# Patient Record
Sex: Female | Born: 1980 | Race: Black or African American | Hispanic: No | Marital: Single | Smoking: Current some day smoker
Health system: Southern US, Community
[De-identification: ages and names within clinical notes are randomized; demographics above are authoritative.]

## PROBLEM LIST (undated history)

## (undated) DIAGNOSIS — Z789 Other specified health status: Secondary | ICD-10-CM

## (undated) DIAGNOSIS — T8859XA Other complications of anesthesia, initial encounter: Secondary | ICD-10-CM

## (undated) DIAGNOSIS — T4145XA Adverse effect of unspecified anesthetic, initial encounter: Secondary | ICD-10-CM

## (undated) DIAGNOSIS — D649 Anemia, unspecified: Secondary | ICD-10-CM

## (undated) HISTORY — PX: CHOLECYSTECTOMY: SHX55

---
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
## 2004-07-06 ENCOUNTER — Ambulatory Visit: Payer: Self-pay

## 2004-12-14 ENCOUNTER — Ambulatory Visit: Payer: Self-pay | Admitting: General Surgery

## 2008-01-24 ENCOUNTER — Emergency Department: Payer: Self-pay | Admitting: Internal Medicine

## 2009-01-08 ENCOUNTER — Emergency Department: Payer: Self-pay | Admitting: Emergency Medicine

## 2011-03-30 ENCOUNTER — Observation Stay: Payer: Self-pay

## 2011-05-05 ENCOUNTER — Inpatient Hospital Stay: Payer: Self-pay | Admitting: Obstetrics and Gynecology

## 2012-08-09 ENCOUNTER — Emergency Department: Payer: Self-pay | Admitting: Emergency Medicine

## 2012-08-09 LAB — URINALYSIS, COMPLETE
Glucose,UR: NEGATIVE mg/dL (ref 0–75)
Ketone: NEGATIVE
Leukocyte Esterase: NEGATIVE
Nitrite: NEGATIVE
Protein: NEGATIVE
Squamous Epithelial: 1
WBC UR: 1 /HPF (ref 0–5)

## 2012-08-09 LAB — CBC
HCT: 38.3 % (ref 35.0–47.0)
HGB: 12.6 g/dL (ref 12.0–16.0)
MCH: 28.1 pg (ref 26.0–34.0)
MCHC: 32.9 g/dL (ref 32.0–36.0)
MCV: 85 fL (ref 80–100)
Platelet: 284 10*3/uL (ref 150–440)
RBC: 4.49 10*6/uL (ref 3.80–5.20)
RDW: 14.3 % (ref 11.5–14.5)
WBC: 13.1 10*3/uL — ABNORMAL HIGH (ref 3.6–11.0)

## 2012-08-09 LAB — COMPREHENSIVE METABOLIC PANEL
Anion Gap: 8 (ref 7–16)
BUN: 13 mg/dL (ref 7–18)
Bilirubin,Total: 0.3 mg/dL (ref 0.2–1.0)
Co2: 23 mmol/L (ref 21–32)
Creatinine: 0.96 mg/dL (ref 0.60–1.30)
EGFR (African American): >60
Glucose: 91 mg/dL (ref 65–99)
Osmolality: 277 (ref 275–301)
SGOT(AST): 13 U/L — ABNORMAL LOW (ref 15–37)
Sodium: 139 mmol/L (ref 136–145)
Total Protein: 7.6 g/dL (ref 6.4–8.2)

## 2012-08-09 LAB — PREGNANCY, URINE: Pregnancy Test, Urine: NEGATIVE m[IU]/mL

## 2014-01-07 ENCOUNTER — Emergency Department: Payer: Self-pay | Admitting: Emergency Medicine

## 2014-06-09 ENCOUNTER — Emergency Department: Payer: Self-pay | Admitting: Emergency Medicine

## 2014-06-12 LAB — BETA STREP CULTURE(ARMC)

## 2016-06-28 NOTE — L&D Delivery Note (Signed)
Delivery Note At 7:16 PM a viable and healthy female "Katrina Jimenez" was delivered via Vaginal, Spontaneous Delivery (Presentation: ROA).  APGAR: 1,8; weight 6 lb 8.4 oz (2960 g).   Placenta status: spontaneous.  Cord: 3VC  with the following complications: none .  Cord pH: 7.19.  Anesthesia:  epidural Episiotomy: None Lacerations:  Bilateral labial, reapproximated Suture Repair: n/a Est. Blood Loss (mL): 300   Mom to postpartum.  Baby to Couplet care / Skin to Skin.  36yo [id number] at 37+2wks presented to triage with contractions, and found to have elevated blood pressures. I proceeded with plan for induction. However, on admission she was contracting and her cervix was difficult to reach, because of a hx of polyhydramnios and bulging bag of water - I could not feel her cervix at any time during her labor course.  She was uncomfortable and contracting and received an epidural. Fetal head still vertex by ultrasound prior to rupture. AROM carefully after discussion of possible outcomes, with fundal pressure and small wire hole, carefully guiding baby to the pelvis. Just prior to AROM, the fetal heart tones began to have deep decels, and I discussed that she needed to deliver quickly.  Once AROM, the baby was at 2+ and she pushed effectively and quickly and delivered over an intact perineum. Initial cry, then clamped and cut the cord and carried the baby to the warmer. heartrate <60, PPV started with attempts at spontaneous breathing. NP entered the room, and I returned to the perineum. Arterial cord blood collected.  Placenta delivered spontaneously. Minimal tearing, minimal bleeding. Mom tolerated procedure well.  Plans for bottle feeding and BTL.  [name] 04/22/2017, 7:47 PM

## 2016-10-27 ENCOUNTER — Other Ambulatory Visit: Payer: Self-pay | Admitting: Obstetrics and Gynecology

## 2016-10-27 DIAGNOSIS — Z369 Encounter for antenatal screening, unspecified: Secondary | ICD-10-CM

## 2016-11-01 ENCOUNTER — Encounter: Payer: Self-pay | Admitting: *Deleted

## 2016-11-01 ENCOUNTER — Ambulatory Visit
Admission: RE | Admit: 2016-11-01 | Discharge: 2016-11-01 | Disposition: A | Discharge status: Home / Self Care | Payer: Medicaid Other | Source: Ambulatory Visit | Attending: Maternal & Fetal Medicine | Admitting: Maternal & Fetal Medicine

## 2016-11-01 VITALS — BP 136/60 | HR 66 | Temp 98.2°F | Resp 17 | Ht 68.0 in | Wt 270.0 lb

## 2016-11-01 DIAGNOSIS — Z3A11 11 weeks gestation of pregnancy: Secondary | ICD-10-CM | POA: Diagnosis not present

## 2016-11-01 DIAGNOSIS — O09521 Supervision of elderly multigravida, first trimester: Secondary | ICD-10-CM | POA: Insufficient documentation

## 2016-11-01 DIAGNOSIS — Z369 Encounter for antenatal screening, unspecified: Secondary | ICD-10-CM

## 2016-11-01 HISTORY — DX: Other specified health status: Z78.9

## 2016-11-01 NOTE — Progress Notes (Signed)
Patient seen by me,  I agree with assessment and plan as outlined in [facility]'s note.

## 2016-11-01 NOTE — Progress Notes (Signed)
Referring Provider:   [facility] Length of Consultation: 45 minutes  Ms. Thurmond ButtsWade was [facility] for genetic counseling because of advanced maternal age.  The patient will be 36 years old at the time of delivery.  This note summarizes the information we discussed.    We explained that the chance of a chromosome abnormality increases with maternal age.  Chromosomes and examples of chromosome problems were reviewed.  Humans typically have 46 chromosomes in each cell, with half passed through each sperm and egg.  Any change in the number or structure of chromosomes can increase the risk of problems in the physical and mental development of a pregnancy.   Based upon age of the patient, the chance of any chromosome abnormality was 1 in 31114. The chance of Down syndrome, the most common chromosome problem associated with maternal age, was 1 in 75238.  The risk of chromosome problems is in addition to the 3% general population risk for birth defects and mental retardation.  The greatest chance, of course, is that the baby would be born in good health.  We discussed the following prenatal screening and testing options for this pregnancy:  First trimester screening, which includes nuchal translucency ultrasound screen and first trimester maternal serum marker screening.  The nuchal translucency has approximately an 80% detection rate for Down syndrome and can be positive for other chromosome abnormalities as well as heart defects.  When combined with a maternal serum marker screening, the detection rate is up to 90% for Down syndrome and up to 97% for trisomy 18.     The chorionic villus sampling procedure is available for first trimester chromosome analysis.  This involves the withdrawal of a small amount of chorionic villi (tissue from the developing placenta).  Risk of pregnancy loss is estimated to be approximately 1 in 200 to 1 in 100 (0.5 to 1%).  There is  approximately a 1% (1 in 100) chance that the CVS chromosome results will be unclear.  Chorionic villi cannot be tested for neural tube defects.     Maternal serum marker screening, a blood test that measures pregnancy proteins, can provide risk assessments for Down syndrome, trisomy 18, and open neural tube defects (spina bifida, anencephaly). Because it does not directly examine the fetus, it cannot positively diagnose or rule out these problems.  Targeted ultrasound uses high frequency sound waves to create an image of the developing fetus.  An ultrasound is often recommended as a routine means of evaluating the pregnancy.  It is also used to screen for fetal anatomy problems (for example, a heart defect) that might be suggestive of a chromosomal or other abnormality.   Amniocentesis involves the removal of a small amount of amniotic fluid from the sac surrounding the fetus with the use of a thin needle inserted through the maternal abdomen and uterus.  Ultrasound guidance is used throughout the procedure.  Fetal cells from amniotic fluid are directly evaluated and > 99.5% of chromosome problems and > 98% of open neural tube defects can be detected. This procedure is generally performed after the 15th week of pregnancy.  The main risks to this procedure include complications leading to miscarriage in less than 1 in 200 cases (0.5%).  We also reviewed the availability of cell free fetal DNA testing from maternal blood to determine whether or not the baby may have either Down syndrome, trisomy 2013, or trisomy 5118.  This test utilizes a maternal blood sample and  DNA sequencing technology to isolate circulating cell free fetal DNA from maternal plasma.  The fetal DNA can then be analyzed for DNA sequences that are derived from the three most common chromosomes involved in aneuploidy, chromosomes 13, 18, and 21.  If the overall amount of DNA is greater than the expected level for any of these chromosomes,  aneuploidy is suspected.  While we do not consider it a replacement for invasive testing and karyotype analysis, a negative result from this testing would be reassuring, though not a guarantee of a normal chromosome complement for the baby.  An abnormal result is certainly suggestive of an abnormal chromosome complement, though we would still recommend CVS or amniocentesis to confirm any findings from this testing.  Cystic Fibrosis and Spinal Muscular Atrophy (SMA) screening were also discussed with the patient. Both conditions are recessive, which means that both parents must be carriers in order to have a child with the disease.  Cystic fibrosis (CF) is one of the most common genetic conditions in persons of Caucasian ancestry.  This condition occurs in approximately 1 in 2,500 Caucasian persons and results in thickened secretions in the lungs, digestive, and reproductive systems.  For a baby to be at risk for having CF, both of the parents must be carriers for this condition.  Approximately 1 in 15 Caucasian persons is a carrier for CF.  Current carrier testing looks for the most common mutations in the gene for CF and can detect approximately 90% of carriers in the Caucasian population.  This means that the carrier screening can greatly reduce, but cannot eliminate, the chance for an individual to have a child with CF.  If an individual is found to be a carrier for CF, then carrier testing would be available for the partner. As part of [place] 's newborn screening profile, all babies born in the state of [place] will have a two-tier screening process.  Specimens are first tested to determine the concentration of immunoreactive trypsinogen (IRT).  The top 5% of specimens with the highest IRT values then undergo DNA testing using a panel of over 40 common CF mutations. SMA is a neurodegenerative disorder that leads to atrophy of skeletal muscle and overall weakness.  This condition is also more  prevalent in the Caucasian population, with 1 in 40-1 in 60 persons being a carrier and 1 in 6,000-1 in 10,000 children being affected.  There are multiple forms of the disease, with some causing death in infancy to other forms with survival into adulthood.  The genetics of SMA is complex, but carrier screening can detect up to 95% of carriers in the Caucasian population.  Similar to CF, a negative result can greatly reduce, but cannot eliminate, the chance to have a child with SMA.  Ms. Widener was already screened for hemoglobinopathies, which was normal (AA, MCV=86).  We obtained a detailed family history and pregnancy history.  Ms. Giebler reported that her paternal uncle was born with spina bifida.  He had surgery soon after birth.  He is in a wheelchair, has club feet, bladder control issues, developmental delays and a shunt.  She also had a great uncle who was born with spina bifida who passed away at age 57 years.  Neither relative had any other types of birth differences, and no other diagnosis was known as to the cause for their conditions. The remainder of the family history is unremarkable for birth defects, developmental delays, recurrent pregnancy loss or known chromosome abnormalities.  To  review, neural tube defects (also called spina bifida) happen very early in pregnancy.  During development, the spinal column starts out as a flat sheet of cells.  At approximately 4 weeks after conception, the sheet of cells begins to curl and form a tube.  This tube begins to fuse and zip closed.  It remains unclear if the closing begins at one point or starts at several places along the length of the spine.  Once the column is closed, the skull bones begin to form, initiating brain and other neural element formation.  If there is failure in the fusion of the tube, then an opening results.  The higher the opening, the more neurological problems typically result.  An opening at the area of the skull results in  anencephaly, which is not compatible with the baby surviving very long after birth.  A lower opening may result in lower body weakness to paralysis.  Intellectual disabilities may also occur due to excess fluid in the brain.  Most often, neural tube defects occur as an isolated birth defect, however, some are the result of changes in genes or in the number or structure of the chromosomes.  In the absence of other birth defects or a known genetic syndrome as the cause,  the chance for a neural tube defect is estimated to be less than 1%, which is increased above the general population risk of 1-2 per 1,000.  If a more specific recurrence risk is desired, we are happy to review medical records on either relative to determine if any genetic syndromes may have been diagnosed which would alter the recurrence chance.  There are 2 non-invasive testing options available to assess the chance for a neural tube defect in this pregnancy.  These are maternal serum AFP screening and level 2 ultrasound.  It is important to remember that every pregnancy has a risk of about 2-3% for having a birth defect.  Spina bifida is included in that number.  We discussed the need for taking folic acid in the future, particularly if you may become pregnant.  Studies have shown that taking 4 mg of folic acid each day can decrease the chance for a parent to have another child with spina bifida.  Ms. Gorton stated that this is her sixth pregnancy.  She has two children, a 61 year old daughter and a 41 year old son, both of whom are in good health.  She had three elective terminations for personal reasons.  The father of the baby shares her 108 year old son and has two other healthy boys from a prior relationship.  She reported no complications or exposures in this pregnancy that would be expected to increase the risk for birth defects.  She was smoking cigarettes prior to learning that she was pregnant, but stopped right away.  After  consideration of the options, Ms. Lueth elected to proceed with an ultrasound and cell free DNA testing.  She will scheduled her detailed anatomy ultrasound here at [facility] at [redacted] weeks gestation due to her age as well as the history of spina bifida in the family.  She will plan for msAFP testing at her OB at the appropriate gestation.  An ultrasound was performed at the time of the visit.  The gestational age was consistent with  12 weeks.  Fetal anatomy could not be assessed due to early gestational age.  Please refer to the ultrasound report for details of that study.  Ms. Gabor was encouraged to  call with questions or concerns.  We can be contacted at [phone].   Cherly Anderson, MS, CGC

## 2016-11-07 LAB — INFORMASEQ(SM) WITH XY ANALYSIS
Fetal Fraction (%):: 8.4
Fetal Number: 1
Gestational Age at Collection: 12.7 weeks
WEIGHT: 270 [lb_av]

## 2016-11-08 ENCOUNTER — Telehealth: Payer: Self-pay | Admitting: Obstetrics and Gynecology

## 2016-11-08 NOTE — Telephone Encounter (Signed)
The patient was informed of the results of her recent InformaSeq testing (performed at [facility]) which yielded NEGATIVE results.  The patient's specimen showed DNA consistent with two copies of chromosomes 21, 18 and 13.  The sensitivity for trisomy 21, trisomy 18 and trisomy 13 using this testing are reported as 99.1%, 98.3% and 98.1% respectively.  Thus, while the results of this testing are highly accurate, they are not considered diagnostic at this time.  Should more definitive information be desired, the patient may still consider amniocentesis.   As requested to know by the patient, sex chromosome analysis was included for this sample.  Results was consistent with a female (XX) fetus. This is predicted with >97% accuracy.  A maternal serum AFP only should be considered if screening for neural tube defects is desired.  Ernesha Ramone F. Trestan Vahle, MS, CGC   

## 2016-12-02 ENCOUNTER — Other Ambulatory Visit: Payer: Self-pay | Admitting: *Deleted

## 2016-12-02 DIAGNOSIS — O09522 Supervision of elderly multigravida, second trimester: Secondary | ICD-10-CM

## 2016-12-06 ENCOUNTER — Ambulatory Visit
Admission: RE | Admit: 2016-12-06 | Discharge: 2016-12-06 | Disposition: A | Discharge status: Home / Self Care | Payer: Medicaid Other | Source: Ambulatory Visit | Attending: Obstetrics and Gynecology | Admitting: Obstetrics and Gynecology

## 2016-12-06 DIAGNOSIS — O99210 Obesity complicating pregnancy, unspecified trimester: Secondary | ICD-10-CM

## 2016-12-06 DIAGNOSIS — O09522 Supervision of elderly multigravida, second trimester: Secondary | ICD-10-CM | POA: Insufficient documentation

## 2016-12-06 DIAGNOSIS — Z3A17 17 weeks gestation of pregnancy: Secondary | ICD-10-CM | POA: Diagnosis not present

## 2016-12-06 DIAGNOSIS — O99212 Obesity complicating pregnancy, second trimester: Secondary | ICD-10-CM | POA: Diagnosis present

## 2016-12-16 ENCOUNTER — Other Ambulatory Visit: Payer: Self-pay | Admitting: *Deleted

## 2016-12-16 DIAGNOSIS — Z0489 Encounter for examination and observation for other specified reasons: Secondary | ICD-10-CM

## 2016-12-16 DIAGNOSIS — O09522 Supervision of elderly multigravida, second trimester: Secondary | ICD-10-CM

## 2016-12-16 DIAGNOSIS — IMO0002 Reserved for concepts with insufficient information to code with codable children: Secondary | ICD-10-CM

## 2016-12-20 ENCOUNTER — Ambulatory Visit
Admission: RE | Admit: 2016-12-20 | Discharge: 2016-12-20 | Disposition: A | Discharge status: Home / Self Care | Payer: Medicaid Other | Source: Ambulatory Visit | Attending: Maternal & Fetal Medicine | Admitting: Maternal & Fetal Medicine

## 2016-12-20 DIAGNOSIS — Z0489 Encounter for examination and observation for other specified reasons: Secondary | ICD-10-CM

## 2016-12-20 DIAGNOSIS — IMO0002 Reserved for concepts with insufficient information to code with codable children: Secondary | ICD-10-CM

## 2016-12-20 DIAGNOSIS — Z3A19 19 weeks gestation of pregnancy: Secondary | ICD-10-CM | POA: Insufficient documentation

## 2016-12-20 DIAGNOSIS — O99212 Obesity complicating pregnancy, second trimester: Secondary | ICD-10-CM | POA: Insufficient documentation

## 2016-12-20 DIAGNOSIS — O09522 Supervision of elderly multigravida, second trimester: Secondary | ICD-10-CM | POA: Diagnosis present

## 2016-12-20 DIAGNOSIS — Z048 Encounter for examination and observation for other specified reasons: Secondary | ICD-10-CM | POA: Diagnosis not present

## 2017-01-14 ENCOUNTER — Ambulatory Visit: Payer: Medicaid Other | Admitting: Dietician

## 2017-01-20 ENCOUNTER — Encounter: Payer: Self-pay | Admitting: Dietician

## 2017-02-18 ENCOUNTER — Ambulatory Visit: Payer: Medicaid Other | Admitting: Dietician

## 2017-03-28 ENCOUNTER — Other Ambulatory Visit: Payer: Self-pay | Admitting: Obstetrics and Gynecology

## 2017-03-28 ENCOUNTER — Other Ambulatory Visit: Payer: Self-pay | Admitting: *Deleted

## 2017-03-28 DIAGNOSIS — Z3689 Encounter for other specified antenatal screening: Secondary | ICD-10-CM

## 2017-03-28 DIAGNOSIS — O409XX0 Polyhydramnios, unspecified trimester, not applicable or unspecified: Secondary | ICD-10-CM

## 2017-03-31 ENCOUNTER — Ambulatory Visit
Admission: RE | Admit: 2017-03-31 | Discharge: 2017-03-31 | Disposition: A | Discharge status: Home / Self Care | Payer: Medicaid Other | Source: Ambulatory Visit | Attending: Obstetrics and Gynecology | Admitting: Obstetrics and Gynecology

## 2017-03-31 DIAGNOSIS — O409XX0 Polyhydramnios, unspecified trimester, not applicable or unspecified: Secondary | ICD-10-CM

## 2017-03-31 DIAGNOSIS — O409XX3 Polyhydramnios, unspecified trimester, fetus 3: Secondary | ICD-10-CM | POA: Diagnosis not present

## 2017-03-31 DIAGNOSIS — Z3A34 34 weeks gestation of pregnancy: Secondary | ICD-10-CM | POA: Diagnosis not present

## 2017-04-13 ENCOUNTER — Encounter
Admission: RE | Admit: 2017-04-13 | Discharge: 2017-04-13 | Disposition: A | Discharge status: Home / Self Care | Payer: Medicaid Other | Source: Ambulatory Visit | Attending: Anesthesiology | Admitting: Anesthesiology

## 2017-04-13 NOTE — Consult Note (Signed)
Anesthesiology consult note ( Ambulatory referral to OB anesthesia for morbid obesity) :       I had the distinct pleasure of meeting Katrina Jimenez today for an OB anesthesia precheck.  She has a Hx of morbid obesity with a BMI today of 43 which puts her at the borderline for BMI.  Patient reports no previous problems with anesthesia, no problems with her back other than back pain with pregnancy and has a MP score of 2.  Her EDD is 11/14.  We discussed that in the next few weeks, she will need to watch her diet carefully and attempt some weight control with more nutritional choices along with water as the liquid of choice.  We discussed the possibility that if her weight increases much above 50 BMI we may have to make arrangements for delivery at a major medical center.  That her weight could make her higher risk from an anesthesia standpoint.  Patient appears to understand.  Obviously, her [facility] need [facility] and arrangements made if a marked change in BMI.  If she can stay at a BMI of 50 - 52 or less, we can deliver here.

## 2017-04-22 ENCOUNTER — Inpatient Hospital Stay
Admission: EM | Admit: 2017-04-22 | Discharge: 2017-04-24 | DRG: 798 | Disposition: A | Discharge status: Home / Self Care | Payer: Medicaid Other | Attending: Obstetrics and Gynecology | Admitting: Obstetrics and Gynecology

## 2017-04-22 ENCOUNTER — Inpatient Hospital Stay: Payer: Medicaid Other | Admitting: Registered Nurse

## 2017-04-22 DIAGNOSIS — Z87891 Personal history of nicotine dependence: Secondary | ICD-10-CM

## 2017-04-22 DIAGNOSIS — E669 Obesity, unspecified: Secondary | ICD-10-CM | POA: Diagnosis present

## 2017-04-22 DIAGNOSIS — Z3A37 37 weeks gestation of pregnancy: Secondary | ICD-10-CM

## 2017-04-22 DIAGNOSIS — O0993 Supervision of high risk pregnancy, unspecified, third trimester: Secondary | ICD-10-CM

## 2017-04-22 DIAGNOSIS — R030 Elevated blood-pressure reading, without diagnosis of hypertension: Secondary | ICD-10-CM | POA: Diagnosis present

## 2017-04-22 DIAGNOSIS — O26893 Other specified pregnancy related conditions, third trimester: Secondary | ICD-10-CM | POA: Diagnosis present

## 2017-04-22 DIAGNOSIS — Z7982 Long term (current) use of aspirin: Secondary | ICD-10-CM

## 2017-04-22 DIAGNOSIS — O99214 Obesity complicating childbirth: Secondary | ICD-10-CM | POA: Diagnosis present

## 2017-04-22 DIAGNOSIS — Z302 Encounter for sterilization: Secondary | ICD-10-CM

## 2017-04-22 LAB — CBC
HCT: 33.7 % — ABNORMAL LOW (ref 35.0–47.0)
Hemoglobin: 10.9 g/dL — ABNORMAL LOW (ref 12.0–16.0)
MCH: 26.5 pg (ref 26.0–34.0)
MCHC: 32.3 g/dL (ref 32.0–36.0)
MCV: 82.1 fL (ref 80.0–100.0)
PLATELETS: 250 10*3/uL (ref 150–440)
RBC: 4.1 MIL/uL (ref 3.80–5.20)
RDW: 15.7 % — AB (ref 11.5–14.5)
WBC: 13.2 10*3/uL — ABNORMAL HIGH (ref 3.6–11.0)

## 2017-04-22 LAB — COMPREHENSIVE METABOLIC PANEL
ALBUMIN: 2.6 g/dL — AB (ref 3.5–5.0)
ALT: 9 U/L — ABNORMAL LOW (ref 14–54)
AST: 23 U/L (ref 15–41)
Alkaline Phosphatase: 171 U/L — ABNORMAL HIGH (ref 38–126)
Anion gap: 8 (ref 5–15)
BILIRUBIN TOTAL: 0.4 mg/dL (ref 0.3–1.2)
CO2: 22 mmol/L (ref 22–32)
CREATININE: 0.8 mg/dL (ref 0.44–1.00)
Calcium: 8.8 mg/dL — ABNORMAL LOW (ref 8.9–10.3)
Chloride: 107 mmol/L (ref 101–111)
GFR calc Af Amer: >60 mL/min (ref >60)
GFR calc non Af Amer: >60 mL/min (ref >60)
GLUCOSE: 114 mg/dL — AB (ref 65–99)
Potassium: 3.5 mmol/L (ref 3.5–5.1)
Sodium: 137 mmol/L (ref 135–145)
TOTAL PROTEIN: 6.3 g/dL — AB (ref 6.5–8.1)

## 2017-04-22 LAB — TYPE AND SCREEN
ABO/RH(D): O POS
ANTIBODY SCREEN: NEGATIVE

## 2017-04-22 LAB — PROTEIN / CREATININE RATIO, URINE
Creatinine, Urine: 151 mg/dL
PROTEIN CREATININE RATIO: 0.11 mg/mg{creat} (ref 0.00–0.15)
TOTAL PROTEIN, URINE: 17 mg/dL

## 2017-04-22 MED ORDER — OXYTOCIN 10 UNIT/ML IJ SOLN
INTRAMUSCULAR | Status: AC
  Filled 2017-04-22: qty 2

## 2017-04-22 MED ORDER — SOD CITRATE-CITRIC ACID 500-334 MG/5ML PO SOLN: 30.0000 mL | ORAL | Status: DC | PRN

## 2017-04-22 MED ORDER — OXYTOCIN BOLUS FROM INFUSION
500.0000 mL | Freq: Once | INTRAVENOUS | Status: AC
  Administered 2017-04-22: 500 mL via INTRAVENOUS

## 2017-04-22 MED ORDER — OXYCODONE HCL 5 MG PO TABS
5.0000 mg | ORAL_TABLET | ORAL | Status: DC | PRN
  Administered 2017-04-23 (×3): 5 mg via ORAL
  Filled 2017-04-22 (×3): qty 1

## 2017-04-22 MED ORDER — PRENATAL MULTIVITAMIN CH
1.0000 | ORAL_TABLET | Freq: Every day | ORAL | Status: DC
  Administered 2017-04-23 – 2017-04-24 (×2): 1 via ORAL
  Filled 2017-04-22 (×2): qty 1

## 2017-04-22 MED ORDER — AMMONIA AROMATIC IN INHA
RESPIRATORY_TRACT | Status: AC
  Filled 2017-04-22: qty 10

## 2017-04-22 MED ORDER — BISACODYL 10 MG RE SUPP
10.0000 mg | Freq: Every day | RECTAL | Status: DC | PRN
  Filled 2017-04-22: qty 1

## 2017-04-22 MED ORDER — LIDOCAINE HCL (PF) 1 % IJ SOLN
INTRAMUSCULAR | Status: DC | PRN
  Administered 2017-04-22: 3 mL via SUBCUTANEOUS

## 2017-04-22 MED ORDER — SODIUM CHLORIDE 0.9 % IV SOLN
250.0000 mL | INTRAVENOUS | Status: DC | PRN
Start: 2017-04-22 — End: 2017-04-24

## 2017-04-22 MED ORDER — ACETAMINOPHEN 325 MG PO TABS
650.0000 mg | ORAL_TABLET | ORAL | Status: DC | PRN
  Administered 2017-04-23 – 2017-04-24 (×3): 650 mg via ORAL
  Filled 2017-04-22 (×3): qty 2

## 2017-04-22 MED ORDER — ZOLPIDEM TARTRATE 5 MG PO TABS: 5.0000 mg | ORAL_TABLET | Freq: Every evening | ORAL | Status: DC | PRN

## 2017-04-22 MED ORDER — SIMETHICONE 80 MG PO CHEW: 80.0000 mg | CHEWABLE_TABLET | ORAL | Status: DC | PRN

## 2017-04-22 MED ORDER — TERBUTALINE SULFATE 1 MG/ML IJ SOLN: 0.2500 mg | Freq: Once | INTRAMUSCULAR | Status: DC | PRN

## 2017-04-22 MED ORDER — CARBOPROST TROMETHAMINE 250 MCG/ML IM SOLN
INTRAMUSCULAR | Status: AC
  Filled 2017-04-22: qty 1

## 2017-04-22 MED ORDER — LACTATED RINGERS IV SOLN: 500.0000 mL | INTRAVENOUS | Status: DC | PRN

## 2017-04-22 MED ORDER — ACETAMINOPHEN 325 MG PO TABS: 650.0000 mg | ORAL_TABLET | ORAL | Status: DC | PRN

## 2017-04-22 MED ORDER — OXYTOCIN 40 UNITS IN LACTATED RINGERS INFUSION - SIMPLE MED: 1.0000 m[IU]/min | INTRAVENOUS | Status: DC

## 2017-04-22 MED ORDER — FENTANYL 2.5 MCG/ML W/ROPIVACAINE 0.15% IN NS 100 ML EPIDURAL (ARMC)
EPIDURAL | Status: DC | PRN
Start: 2017-04-22 — End: 2017-04-22
  Administered 2017-04-22: 12 mL/h via EPIDURAL

## 2017-04-22 MED ORDER — IBUPROFEN 600 MG PO TABS
ORAL_TABLET | ORAL | Status: AC
  Administered 2017-04-22: 600 mg via ORAL
  Filled 2017-04-22: qty 1

## 2017-04-22 MED ORDER — LACTATED RINGERS IV SOLN
INTRAVENOUS | Status: DC
  Administered 2017-04-22 – 2017-04-23 (×3): via INTRAVENOUS

## 2017-04-22 MED ORDER — SENNOSIDES-DOCUSATE SODIUM 8.6-50 MG PO TABS
2.0000 | ORAL_TABLET | ORAL | Status: DC
  Administered 2017-04-23 – 2017-04-24 (×2): 2 via ORAL
  Filled 2017-04-22 (×3): qty 2

## 2017-04-22 MED ORDER — SODIUM CHLORIDE 0.9% FLUSH: 3.0000 mL | INTRAVENOUS | Status: DC | PRN

## 2017-04-22 MED ORDER — IBUPROFEN 600 MG PO TABS
600.0000 mg | ORAL_TABLET | Freq: Four times a day (QID) | ORAL | Status: DC
  Administered 2017-04-22: 600 mg via ORAL

## 2017-04-22 MED ORDER — FENTANYL 2.5 MCG/ML W/ROPIVACAINE 0.15% IN NS 100 ML EPIDURAL (ARMC)
EPIDURAL | Status: AC
  Filled 2017-04-22: qty 100

## 2017-04-22 MED ORDER — MEASLES, MUMPS & RUBELLA VAC ~~LOC~~ INJ
0.5000 mL | INJECTION | Freq: Once | SUBCUTANEOUS | Status: DC
  Filled 2017-04-22: qty 0.5

## 2017-04-22 MED ORDER — TETANUS-DIPHTH-ACELL PERTUSSIS 5-2.5-18.5 LF-MCG/0.5 IM SUSP: 0.5000 mL | Freq: Once | INTRAMUSCULAR | Status: DC

## 2017-04-22 MED ORDER — SODIUM CHLORIDE 0.9 % IV SOLN: 2.0000 g | Freq: Once | INTRAVENOUS | Status: DC

## 2017-04-22 MED ORDER — BENZOCAINE-MENTHOL 20-0.5 % EX AERO: 1.0000 "application " | INHALATION_SPRAY | CUTANEOUS | Status: DC | PRN

## 2017-04-22 MED ORDER — METHYLERGONOVINE MALEATE 0.2 MG/ML IJ SOLN
INTRAMUSCULAR | Status: AC
  Filled 2017-04-22: qty 1

## 2017-04-22 MED ORDER — FLEET ENEMA 7-19 GM/118ML RE ENEM: 1.0000 | ENEMA | Freq: Every day | RECTAL | Status: DC | PRN

## 2017-04-22 MED ORDER — MISOPROSTOL 200 MCG PO TABS
ORAL_TABLET | ORAL | Status: AC
  Filled 2017-04-22: qty 4

## 2017-04-22 MED ORDER — ONDANSETRON HCL 4 MG/2ML IJ SOLN: 4.0000 mg | Freq: Four times a day (QID) | INTRAMUSCULAR | Status: DC | PRN

## 2017-04-22 MED ORDER — HYDRALAZINE HCL 20 MG/ML IJ SOLN: 10.0000 mg | Freq: Once | INTRAMUSCULAR | Status: DC | PRN

## 2017-04-22 MED ORDER — BUTORPHANOL TARTRATE 2 MG/ML IJ SOLN: 1.0000 mg | INTRAMUSCULAR | Status: DC | PRN

## 2017-04-22 MED ORDER — OXYTOCIN 40 UNITS IN LACTATED RINGERS INFUSION - SIMPLE MED
2.5000 [IU]/h | INTRAVENOUS | Status: DC
  Filled 2017-04-22: qty 1000

## 2017-04-22 MED ORDER — DIBUCAINE 1 % RE OINT: 1.0000 "application " | TOPICAL_OINTMENT | RECTAL | Status: DC | PRN

## 2017-04-22 MED ORDER — ONDANSETRON HCL 4 MG/2ML IJ SOLN: 4.0000 mg | INTRAMUSCULAR | Status: DC | PRN

## 2017-04-22 MED ORDER — ONDANSETRON HCL 4 MG PO TABS: 4.0000 mg | ORAL_TABLET | ORAL | Status: DC | PRN

## 2017-04-22 MED ORDER — LABETALOL HCL 5 MG/ML IV SOLN
20.0000 mg | INTRAVENOUS | Status: DC | PRN
  Filled 2017-04-22: qty 16

## 2017-04-22 MED ORDER — DIPHENHYDRAMINE HCL 25 MG PO CAPS: 25.0000 mg | ORAL_CAPSULE | Freq: Four times a day (QID) | ORAL | Status: DC | PRN

## 2017-04-22 MED ORDER — BUPIVACAINE HCL (PF) 0.25 % IJ SOLN
INTRAMUSCULAR | Status: DC | PRN
  Administered 2017-04-22 (×2): 5 mL via EPIDURAL

## 2017-04-22 MED ORDER — WITCH HAZEL-GLYCERIN EX PADS: 1.0000 "application " | MEDICATED_PAD | CUTANEOUS | Status: DC | PRN

## 2017-04-22 MED ORDER — COCONUT OIL OIL: 1.0000 "application " | TOPICAL_OIL | Status: DC | PRN

## 2017-04-22 MED ORDER — LIDOCAINE-EPINEPHRINE (PF) 1.5 %-1:200000 IJ SOLN
INTRAMUSCULAR | Status: DC | PRN
  Administered 2017-04-22: 3 mL via EPIDURAL

## 2017-04-22 MED ORDER — SODIUM CHLORIDE 0.9% FLUSH: 3.0000 mL | Freq: Two times a day (BID) | INTRAVENOUS | Status: DC

## 2017-04-22 MED ORDER — LIDOCAINE HCL (PF) 1 % IJ SOLN
30.0000 mL | INTRAMUSCULAR | Status: DC | PRN
  Filled 2017-04-22: qty 30

## 2017-04-22 NOTE — H&P (Signed)
OB ADMISSION/ HISTORY & PHYSICAL:  Admission Date: 04/22/2017  9:52 AM  Admit Diagnosis: Elevated BP, contractions  Katrina Jimenez is a 36 y.o. female presenting for contractions at 37+2wks, found to have elevated BP to 150s.  Prenatal History: G5P0   EDC : 05/11/2017, by Last Menstrual Period  [facility] Prenatal course complicated by  - Obesity: Prepregnancy BMI 45  If BMI > 45 Anesthesiology consult:ordered 03/23/17 - Polyhydramnios, now resolved [facility] consult on 03/23/17 due to poly at >95% 29.5cms  Polyhydramnios with Vertex on 04/06/17 [facility] Sterilization consent signed 02/15/17  Positive Trich in UA 02/15/2017 Treated, TOC 9/11/18ish  Prenatal labs GTT: 123 GBS:   neg   MBT O+ Ab screen Negative Pap Negative HIV Negative Hep B/RPR Negative/Non reactive Rubella Immune VZV Immune Hgb Frac Profile: WNL  Medical / Surgical History :  Past medical history:  Past Medical History:  Diagnosis Date  . Medical history non-contributory      Past surgical history:  Past Surgical History:  Procedure Laterality Date  . CHOLECYSTECTOMY      Family History:  Family History  Problem Relation Age of Onset  . Hypertension Mother   . Diabetes Mother   . Obesity Mother   . Diabetes Paternal Grandmother      Social History:  reports that she has quit smoking. She has quit using smokeless tobacco. She reports that she does not drink alcohol or use drugs.   Allergies: Patient has no known allergies.    Current Medications at time of admission:  Prior to Admission medications   Medication Sig Start Date End Date Taking? Authorizing Provider  aspirin EC 81 MG tablet Take 81 mg by mouth daily.   Yes [provider]  Prenatal Vit-Fe Fumarate-FA (MULTIVITAMIN-PRENATAL) 27-0.8 MG TABS tablet Take 1 tablet by mouth daily at 12 noon.    [provider]     Review of Systems: Active FM onset of ctx this morning currently  every 7 minutes  Physical Exam:  VS: Blood pressure (!) 164/94, pulse 64, temperature 98.2 F (36.8 C), temperature source Oral, resp. rate 18, last menstrual period 08/04/2016.  General: alert and oriented, appears NAD Heart: RRR Lungs: Clear lung fields Abdomen: Gravid, soft and non-tender, non-distended / uterus: difficult to assess fetal parts due to fluid Extremities: trace edema  Genitalia / VE: Dilation:  (unable to determine) Exam by:: BB, MD  FHR: baseline rate 130 / variability mod / accelerations + / no decelerations TOCO: q5-7  Last US 03/31/17 US:AFI 24.87 95%, NFA:[id number] gm (5#110z) @ 66%. Breech., Post, FHR:147  Assessment: 37+[redacted] weeks gestation 1 stage of labor FHR category 1 Cervical exam unable to be assessed due to bulging bag of water and vaginal redundancy   Plan:   Admit for induction of labor Labs pending Epidural when desired Continuous fetal monitoring   1. Fetal Well being  - Fetal Tracing: 1 - Ultrasound:  reviewed, as above - Group B Streptococcus: neg - Presentation: vtx confirmed by ultrasound   2. Routine OB: - Prenatal labs reviewed, as above - Rh positive  3. Induction of Labor:  -  Contractions external toco in place -  Plan for induction with pitocin  4. Post Partum Planning: - Infant feeding: breast - Contraception: BTL

## 2017-04-22 NOTE — Anesthesia Procedure Notes (Signed)
Epidural Patient location during procedure: OB Start time: 04/22/2017 2:53 PM End time: 04/22/2017 3:00 PM  Staffing Anesthesiologist: Yevette EdwardsADAMS, JAMES G Resident/CRNA: Karoline CaldwellSTARR, Oleva Koo Performed: resident/CRNA   Preanesthetic Checklist Completed: patient identified, site marked, surgical consent, pre-op evaluation, timeout performed, IV checked, risks and benefits discussed and monitors and equipment checked  Epidural Patient position: sitting Prep: Betadine Patient monitoring: heart rate, continuous pulse ox and blood pressure Approach: midline Location: L4-L5 Injection technique: LOR saline  Needle:  Needle type: Tuohy  Needle gauge: 17 G Needle length: 9 cm and 9 Needle insertion depth: 9 cm Catheter type: closed end flexible Catheter size: 19 Gauge Catheter at skin depth: 14 cm Test dose: negative and 1.5% lidocaine with Epi 1:200 K  Assessment Events: blood not aspirated, injection not painful, no injection resistance, negative IV test and no paresthesia  Additional Notes Pt. Evaluated and documentation done after procedure finished. Patient identified. Risks/Benefits/Options discussed with patient including but not limited to bleeding, infection, nerve damage, paralysis, failed block, incomplete pain control, headache, blood pressure changes, nausea, vomiting, reactions to medication both or allergic, itching and postpartum back pain. Confirmed with bedside nurse the patient's most recent platelet count. Confirmed with patient that they are not currently taking any anticoagulation, have any bleeding history or any family history of bleeding disorders. Patient expressed understanding and wished to proceed. All questions were answered. Sterile technique was used throughout the entire procedure. Please see nursing notes for vital signs. Test dose was given through epidural catheter and negative prior to continuing to dose epidural or start infusion. Warning signs of high block given  to the patient including shortness of breath, tingling/numbness in hands, complete motor block, or any concerning symptoms with instructions to call for help. Patient was given instructions on fall risk and not to get out of bed. All questions and concerns addressed with instructions to call with any issues or inadequate analgesia.   Patient tolerated the insertion well without immediate complications.Reason for block:procedure for pain

## 2017-04-22 NOTE — Discharge Summary (Signed)
Obstetrical Discharge Summary  Patient Name: Katrina Jimenez DOB: 1981/03/04 MRN: [id number]  Date of Admission: 04/22/2017 Date of Discharge: 04/24/2017  [facility]   Gestational Age at Delivery: 5545w2d   Antepartum complications:  Prenatal course complicated by  - Obesity: Prepregnancy BMI 45  If BMI > 45 Anesthesiology consult:ordered 03/23/17 - Polyhydramnios, now resolved [facility] consult on 03/23/17 due to poly at >95% 29.5cms  Polyhydramnios with Vertex on 04/06/17 [facility] Sterilization consent signed 02/15/17  Positive Trich in UA 02/15/2017 Treated, TOC 9/11/18ish Admitting Diagnosis: elevated BP, contractions Secondary Diagnosis: Patient Active Problem List   Diagnosis Date Noted  . Supervision of high-risk pregnancy, third trimester 04/22/2017  . Advanced maternal age in multigravida, first trimester     Augmentation: AROM Complications: None Intrapartum complications/course: see NSVD note Date of Delivery: 04/22/17 Delivered By: Christeen DouglasBethany Beasley  Delivery Type: spontaneous vaginal delivery Anesthesia: epidural Placenta: Spontaneous Laceration: none Episiotomy: none Newborn Data: Live born female "Tylasia Mahogany" Birth Weight: 6 lb 8.4 oz (2960 g) APGAR: 1, 8  Newborn Delivery   Birth date/time:  04/22/2017 19:16:00 Delivery type:  Vaginal, Spontaneous Delivery        Discharge Physical Exam:  BP 136/79 (BP Location: Right Arm)   Pulse 64   Temp 98.2 F (36.8 C) (Oral)   Resp 18   Ht 5\' 8"  (1.727 m)   Wt 133.8 kg (295 lb)   LMP 08/04/2016   SpO2 100%     General: NAD CV: RRR Pulm: CTABL, nl effort ABD: s/nd/nt, fundus firm and below the umbilicus Lochia: moderate Incision: c/d/i   DVT Evaluation: LE non-ttp, no evidence of DVT on exam.  Hemoglobin  Date Value Ref Range Status  04/24/2017 10.6 (L) 12.0 - 16.0 g/dL Final   HGB  Date Value Ref Range Status  08/09/2012 12.6 12.0 - 16.0 g/dL Final   HCT  Date  Value Ref Range Status  04/24/2017 32.7 (L) 35.0 - 47.0 % Final  08/09/2012 38.3 35.0 - 47.0 % Final    Post partum course: routine Postpartum Procedures: BTL Disposition: stable, discharge to home. Baby Feeding: formula Baby Disposition: home with mom  Contraception: BTL  Prenatal Labs:  O+ RI VI neg HIV neg HepB neg RPR GBS neg, genetic screening neg   Plan:  Katrina Jimenez was discharged to home in good condition. [facility] with Dr. Dalbert GarnetBeasley in 6 weeks   Discharge Medications: Allergies as of 04/24/2017   No Known Allergies     Medication List    STOP taking these medications   aspirin EC 81 MG tablet     TAKE these medications   ibuprofen 600 MG tablet Commonly known as:  ADVIL,MOTRIN Take 1 tablet (600 mg total) by mouth every 6 (six) hours.   multivitamin-prenatal 27-0.8 MG Tabs tablet Take 1 tablet by mouth daily at 12 noon.   oxyCODONE 5 MG immediate release tablet Commonly known as:  Oxy IR/ROXICODONE Take 1 tablet (5 mg total) by mouth every 4 (four) hours as needed (pain scale 4-7).       Follow-up Information    Christeen DouglasBeasley, Bethany, MD Follow up in 6 week(s).   Specialty:  Obstetrics and Gynecology Contact information: [address] [place] [id number] [phone]           Signed: ----- Ranae Plumberhelsea Edy Belt, MD Attending Obstetrician and Gynecologist [facility], Department of OB/GYN [facility]

## 2017-04-22 NOTE — OB Triage Note (Signed)
Ms. Thurmond ButtsWade here with c/o ctx since 0100, states "they were right on top of each other", reports taking tylenol, drinking water and showering, ctx now around 10 min apart. Rates pain 7/10 with ctx. Denies bleeding, LOF, reports positive fetal movement. Was in office and sent here as MD on call today in [place].

## 2017-04-22 NOTE — Anesthesia Preprocedure Evaluation (Signed)
Anesthesia Evaluation  Patient identified by MRN, date of birth, ID band Patient awake and Patient confused    Reviewed: Allergy & Precautions, H&P , NPO status , Patient's Chart, lab work & pertinent test results  Airway Mallampati: III  TM Distance: >3 FB Neck ROM: full    Dental  (+) Teeth Intact   Pulmonary neg pulmonary ROS, former smoker,    Pulmonary exam normal        Cardiovascular hypertension, Normal cardiovascular exam     Neuro/Psych    GI/Hepatic GERD  Poorly Controlled,  Endo/Other    Renal/GU      Musculoskeletal   Abdominal   Peds  Hematology negative hematology ROS (+)   Anesthesia Other Findings   Reproductive/Obstetrics (+) Pregnancy                             Anesthesia Physical Anesthesia Plan  ASA: II  Anesthesia Plan: Epidural   Post-op Pain Management:    Induction:   PONV Risk Score and Plan:   Airway Management Planned:   Additional Equipment:   Intra-op Plan:   Post-operative Plan:   Informed Consent: I have reviewed the patients History and Physical, chart, labs and discussed the procedure including the risks, benefits and alternatives for the proposed anesthesia with the patient or authorized representative who has indicated his/her understanding and acceptance.     Plan Discussed with: Anesthesiologist and CRNA  Anesthesia Plan Comments:         Anesthesia Quick Evaluation

## 2017-04-22 NOTE — OB Triage Provider Note (Signed)
TRIAGE VISIT with NST   Katrina Jimenez is a 36 y.o. G5P0. She is at 5528w2d gestation, presenting with signs of labor.  Indication: Contractions  S: Resting comfortably. Intermittent CTX, no VB. Active fetal movement. Concerned about blood pressure No headache, new onset swelling, RUQ pain. Recent PreE labs in office on 10/17 were wnl  O:  BP (!) 150/87 (BP Location: Right Arm)   Pulse 67   Temp 98.2 F (36.8 C) (Oral)   Resp 18   LMP 08/04/2016  No results found for this or any previous visit (from the past 48 hour(s)).   Gen: NAD, AAOx3      Abd: FNTTP      Ext: Non-tender, Nonedmeatous    NST/FHT: 135, mod var, +accels. No decels TOCO: quiet SVE:  pending  NST: Reactive. See FHT above for particulars.   A/P:  36 y.o. G5P0 6528w2d with contractions at term and elevated BP. - PreE labs - Urine protein - Cervical exam

## 2017-04-23 ENCOUNTER — Inpatient Hospital Stay: Payer: Medicaid Other | Admitting: Anesthesiology

## 2017-04-23 ENCOUNTER — Encounter: Payer: Self-pay | Admitting: Anesthesiology

## 2017-04-23 ENCOUNTER — Encounter
Admission: EM | Disposition: A | Discharge status: Home / Self Care | Payer: Self-pay | Source: Home / Self Care | Attending: Obstetrics and Gynecology

## 2017-04-23 HISTORY — PX: TUBAL LIGATION: SHX77

## 2017-04-23 LAB — CBC
HCT: 35.7 % (ref 35.0–47.0)
Hemoglobin: 11.6 g/dL — ABNORMAL LOW (ref 12.0–16.0)
MCH: 26.6 pg (ref 26.0–34.0)
MCHC: 32.5 g/dL (ref 32.0–36.0)
MCV: 81.8 fL (ref 80.0–100.0)
PLATELETS: 259 10*3/uL (ref 150–440)
RBC: 4.37 MIL/uL (ref 3.80–5.20)
RDW: 15.8 % — AB (ref 11.5–14.5)
WBC: 21.8 10*3/uL — ABNORMAL HIGH (ref 3.6–11.0)

## 2017-04-23 LAB — RPR: RPR: NONREACTIVE

## 2017-04-23 SURGERY — LIGATION, FALLOPIAN TUBE, POSTPARTUM
Anesthesia: General | Laterality: Bilateral | Wound class: Clean Contaminated

## 2017-04-23 MED ORDER — MIDAZOLAM HCL 2 MG/2ML IJ SOLN
INTRAMUSCULAR | Status: AC
  Filled 2017-04-23: qty 2

## 2017-04-23 MED ORDER — OXYCODONE HCL 5 MG/5ML PO SOLN: 5.0000 mg | Freq: Once | ORAL | Status: DC | PRN

## 2017-04-23 MED ORDER — ONDANSETRON HCL 4 MG/2ML IJ SOLN
INTRAMUSCULAR | Status: DC | PRN
  Administered 2017-04-23: 4 mg via INTRAVENOUS

## 2017-04-23 MED ORDER — ROCURONIUM BROMIDE 50 MG/5ML IV SOLN
INTRAVENOUS | Status: AC
  Filled 2017-04-23: qty 1

## 2017-04-23 MED ORDER — DEXAMETHASONE SODIUM PHOSPHATE 10 MG/ML IJ SOLN
INTRAMUSCULAR | Status: DC | PRN
  Administered 2017-04-23: 10 mg via INTRAVENOUS

## 2017-04-23 MED ORDER — LIDOCAINE HCL (PF) 2 % IJ SOLN
INTRAMUSCULAR | Status: AC
  Filled 2017-04-23: qty 10

## 2017-04-23 MED ORDER — LACTATED RINGERS IV SOLN
INTRAVENOUS | Status: DC | PRN
  Administered 2017-04-23 (×2): via INTRAVENOUS

## 2017-04-23 MED ORDER — SUGAMMADEX SODIUM 500 MG/5ML IV SOLN
INTRAVENOUS | Status: DC | PRN
  Administered 2017-04-23: 300 mg via INTRAVENOUS

## 2017-04-23 MED ORDER — PROPOFOL 10 MG/ML IV BOLUS
INTRAVENOUS | Status: AC
  Filled 2017-04-23: qty 20

## 2017-04-23 MED ORDER — SUGAMMADEX SODIUM 500 MG/5ML IV SOLN
INTRAVENOUS | Status: AC
  Filled 2017-04-23: qty 5

## 2017-04-23 MED ORDER — OXYCODONE HCL 5 MG PO TABS: 5.0000 mg | ORAL_TABLET | Freq: Once | ORAL | Status: DC | PRN

## 2017-04-23 MED ORDER — DEXAMETHASONE SODIUM PHOSPHATE 10 MG/ML IJ SOLN
INTRAMUSCULAR | Status: AC
  Filled 2017-04-23: qty 1

## 2017-04-23 MED ORDER — SEVOFLURANE IN SOLN
RESPIRATORY_TRACT | Status: AC
Start: 2017-04-23 — End: 2017-04-23
  Filled 2017-04-23: qty 250

## 2017-04-23 MED ORDER — BUPIVACAINE HCL 0.5 % IJ SOLN
INTRAMUSCULAR | Status: DC | PRN
  Administered 2017-04-23: 10 mL

## 2017-04-23 MED ORDER — LIDOCAINE HCL (CARDIAC) 20 MG/ML IV SOLN
INTRAVENOUS | Status: DC | PRN
  Administered 2017-04-23: 80 mg via INTRAVENOUS

## 2017-04-23 MED ORDER — FENTANYL CITRATE (PF) 100 MCG/2ML IJ SOLN
25.0000 ug | INTRAMUSCULAR | Status: DC | PRN
  Administered 2017-04-23 (×2): 50 ug via INTRAVENOUS

## 2017-04-23 MED ORDER — OXYCODONE HCL 5 MG PO TABS
10.0000 mg | ORAL_TABLET | ORAL | Status: DC | PRN
  Administered 2017-04-23 – 2017-04-24 (×3): 10 mg via ORAL
  Filled 2017-04-23 (×3): qty 2

## 2017-04-23 MED ORDER — FENTANYL CITRATE (PF) 100 MCG/2ML IJ SOLN
INTRAMUSCULAR | Status: AC
  Filled 2017-04-23: qty 2

## 2017-04-23 MED ORDER — MIDAZOLAM HCL 2 MG/2ML IJ SOLN
INTRAMUSCULAR | Status: DC | PRN
  Administered 2017-04-23: 2 mg via INTRAVENOUS

## 2017-04-23 MED ORDER — PROPOFOL 10 MG/ML IV BOLUS
INTRAVENOUS | Status: DC | PRN
  Administered 2017-04-23: 180 mg via INTRAVENOUS

## 2017-04-23 MED ORDER — FENTANYL CITRATE (PF) 100 MCG/2ML IJ SOLN
INTRAMUSCULAR | Status: DC | PRN
  Administered 2017-04-23: 25 ug via INTRAVENOUS
  Administered 2017-04-23 (×2): 50 ug via INTRAVENOUS

## 2017-04-23 MED ORDER — SUCCINYLCHOLINE CHLORIDE 20 MG/ML IJ SOLN
INTRAMUSCULAR | Status: DC | PRN
  Administered 2017-04-23: 140 mg via INTRAVENOUS

## 2017-04-23 MED ORDER — ROCURONIUM BROMIDE 100 MG/10ML IV SOLN
INTRAVENOUS | Status: DC | PRN
  Administered 2017-04-23: 15 mg via INTRAVENOUS
  Administered 2017-04-23: 5 mg via INTRAVENOUS

## 2017-04-23 MED ORDER — ONDANSETRON HCL 4 MG/2ML IJ SOLN
INTRAMUSCULAR | Status: AC
  Filled 2017-04-23: qty 2

## 2017-04-23 MED ORDER — IBUPROFEN 600 MG PO TABS
600.0000 mg | ORAL_TABLET | Freq: Four times a day (QID) | ORAL | Status: DC
  Administered 2017-04-23 – 2017-04-24 (×2): 600 mg via ORAL
  Filled 2017-04-23 (×2): qty 1

## 2017-04-23 SURGICAL SUPPLY — 31 items
BLADE SURG SZ11 CARB STEEL (BLADE) ×2 IMPLANT
CATH TRAY METER 16FR LF (MISCELLANEOUS) IMPLANT
CHLORAPREP W/TINT 26ML (MISCELLANEOUS) ×2 IMPLANT
DERMABOND ADVANCED (GAUZE/BANDAGES/DRESSINGS) ×1
DERMABOND ADVANCED .7 DNX12 (GAUZE/BANDAGES/DRESSINGS) ×1 IMPLANT
DRAPE LAPAROTOMY 100X77 ABD (DRAPES) ×2 IMPLANT
DRSG TEGADERM 2-3/8X2-3/4 SM (GAUZE/BANDAGES/DRESSINGS) IMPLANT
DRSG TELFA 4X3 1S NADH ST (GAUZE/BANDAGES/DRESSINGS) IMPLANT
ELECT CAUTERY BLADE 6.4 (BLADE) IMPLANT
ELECT REM PT RETURN 9FT ADLT (ELECTROSURGICAL) ×2
ELECTRODE REM PT RTRN 9FT ADLT (ELECTROSURGICAL) ×1 IMPLANT
GLOVE PI ORTHOPRO 6.5 (GLOVE) ×2
GLOVE PI ORTHOPRO STRL 6.5 (GLOVE) ×2 IMPLANT
GLOVE SURG SYN 6.5 ES PF (GLOVE) ×4 IMPLANT
GOWN STRL REUS W/ TWL LRG LVL3 (GOWN DISPOSABLE) ×2 IMPLANT
GOWN STRL REUS W/TWL LRG LVL3 (GOWN DISPOSABLE) ×2
KIT RM TURNOVER CYSTO AR (KITS) ×2 IMPLANT
LABEL OR SOLS (LABEL) IMPLANT
LIGASURE BLUNT 5MM 37CM (INSTRUMENTS) IMPLANT
NDL SAFETY 22GX1.5 (NEEDLE) ×2 IMPLANT
NS IRRIG 500ML POUR BTL (IV SOLUTION) ×2 IMPLANT
PACK BASIN MINOR ARMC (MISCELLANEOUS) ×2 IMPLANT
RETRACTOR WOUND ALXS 18CM SML (MISCELLANEOUS) ×1 IMPLANT
RTRCTR WOUND ALEXIS O 18CM SML (MISCELLANEOUS) ×2
SPONGE LAP 18X18 5 PK (GAUZE/BANDAGES/DRESSINGS) ×2 IMPLANT
SUT MNCRL AB 4-0 PS2 18 (SUTURE) ×2 IMPLANT
SUT VIC AB 0 CT2 27 (SUTURE) ×2 IMPLANT
SUT VIC AB 2-0 CT1 27 (SUTURE) ×2
SUT VIC AB 2-0 CT1 TAPERPNT 27 (SUTURE) ×2 IMPLANT
SUT VIC AB 2-0 UR6 27 (SUTURE) ×2 IMPLANT
SYRINGE 10CC LL (SYRINGE) ×2 IMPLANT

## 2017-04-23 NOTE — Transfer of Care (Signed)
Immediate Anesthesia Transfer of Care Note  Patient: Corinna LinesLatoya M Mataya  Procedure(s) Performed: POST PARTUM TUBAL LIGATION (Bilateral )  Patient Location: PACU  Anesthesia Type:General  Level of Consciousness: awake, alert  and oriented  Airway & Oxygen Therapy: Patient Spontanous Breathing  Post-op Assessment: Report given to RN and Post -op Vital signs reviewed and stable  Post vital signs: Reviewed and stable  Last Vitals:  Vitals:   04/23/17 0500 04/23/17 0929  BP: (!) 147/86 (!) 148/86  Pulse: 64 87  Resp: 20 18  Temp: 37.1 C 36.9 C  SpO2: 99% 100%    Last Pain:  Vitals:   04/23/17 0929  TempSrc: Temporal  PainSc:          Complications: No apparent anesthesia complications

## 2017-04-23 NOTE — Anesthesia Procedure Notes (Signed)
Procedure Name: Intubation Date/Time: 04/23/2017 8:16 AM Performed by: Hedda Slade Pre-anesthesia Checklist: Patient identified, Patient being monitored, Timeout performed, Emergency Drugs available and Suction available Patient Re-evaluated:Patient Re-evaluated prior to induction Oxygen Delivery Method: Circle system utilized Preoxygenation: Pre-oxygenation with 100% oxygen Induction Type: IV induction, Cricoid Pressure applied and Rapid sequence Laryngoscope Size: Mac and 3 Grade View: Grade II Tube type: Oral Tube size: 7.0 mm Number of attempts: 1 Airway Equipment and Method: Stylet Placement Confirmation: ETT inserted through vocal cords under direct vision,  positive ETCO2 and breath sounds checked- equal and bilateral Secured at: 21 cm Tube secured with: Tape Dental Injury: Teeth and Oropharynx as per pre-operative assessment

## 2017-04-23 NOTE — Op Note (Signed)
  Post Partum Tubal Ligation  Indication for procedure: desired permanent sterilization  Pre-op diagnosis: s/p term vaginal delivery, desired permanent sterilization Post-op diagnosis: same Procedure: post partum bilateral tubal ligation Surgeon: Chelsea Jimenez Assist: none Anesthesia: general  IVF: 300cc crystalloid EBL: minimal UOP: none Findings: patent bilateral tubes, normal post-gravid uterine fundus Specimens: portion of left tube, portion of right tube  Complications: none apparent Disposition: stable to PACU  Procedure in detail: The patient was seen and confirmed desire for permanent sterilization.  She was identified as Katrina Jimenez and was brought to the OR.  General anesthesia was administered, and the patient was prepped and draped in the usual sterile fashion.  A surgical time-out was called.  An 11-blade was used to incise the skin in the infraumbilical area.  The subcutaneous tissues were dissected to and then through the fascia, and the peritoneum was grasped and divided.  An alexis retractor was placed in the abdominal cavity and positioned.  The uterus was identified, and the right tube was grasped with a babcock and traced to the fimbriated end.  The mesosalpinx was divided. A kelly clamp was placed along the mesosalpinx and metzenbaums were used to transect the tube from the underlying tissue.  2-0 Vicryl was used to tie the remaining tissue.  The same steps were used on the left side.  All dissected ends were found to be hemostatic.  The alexis retractor was removed, and the fascia was reapproximated with 1 vicryl.  The subcutaneous tissue was irrigated and then the skin was closed with 4-0 monocryl.  The skin was covered in surgical glue.  The sponge, needle, and instrument counts were correct x2.  The patient tolerated the procedure and was brought to PACU in a stable condition.  Katrina Plumberhelsea Ward, MD Attending Obstetrician and Gynecologist [facility]  [facility]

## 2017-04-23 NOTE — Anesthesia Post-op Follow-up Note (Signed)
Anesthesia QCDR form completed.        

## 2017-04-23 NOTE — Progress Notes (Signed)
Patient transported to Pre-Op via bed with tech.

## 2017-04-23 NOTE — Progress Notes (Signed)
Post Partum Day 1 Subjective: Doing well, no complaints.  Tolerating regular diet, but NPO since midnight, voiding and ambulating without difficulty.  No CP SOB F/C N/V or leg pain no HA change of vision, RUQ/epigastric pain  Objective: BP (!) 147/86   Pulse 64   Temp 98.7 F (37.1 C) (Oral)   Resp 20   Ht 5\' 8"  (1.727 m)   Wt 133.8 kg (295 lb)   LMP 08/04/2016   SpO2 99%   Breastfeeding? Unknown   BMI 44.85 kg/m    Physical Exam:  General: NAD CV: RRR Pulm: nl effort, CTABL Lochia: moderate Uterine Fundus: fundus firm and below umbilicus DVT Evaluation: no cords, ttp LEs    Recent Labs  04/22/17 1214 04/23/17 0613  HGB 10.9* 11.6*  HCT 33.7* 35.7  WBC 13.2* 21.8*  PLT 250 259    Assessment/Plan: 36 y.o. [id number] postpartum day # 1  1. Patient doing well overall 2.. BP: no s/sx severe preeclampsia 3. PPTL: patient certain she would like to proceed.  Preoperative evaluation performed, she understands this procedure is permanent.  Will proceed.   ----- Ranae Plumberhelsea Ward, MD Attending Obstetrician and Gynecologist [facility]

## 2017-04-23 NOTE — Anesthesia Postprocedure Evaluation (Signed)
Anesthesia Post Note  Patient: Corinna LinesLatoya M Shappell  Procedure(s) Performed: POST PARTUM TUBAL LIGATION (Bilateral )  Patient location during evaluation: PACU Anesthesia Type: General Level of consciousness: awake and alert Pain management: pain level controlled Vital Signs Assessment: post-procedure vital signs reviewed and stable Respiratory status: spontaneous breathing, nonlabored ventilation, respiratory function stable and patient connected to nasal cannula oxygen Cardiovascular status: blood pressure returned to baseline and stable Postop Assessment: no apparent nausea or vomiting Anesthetic complications: no     Last Vitals:  Vitals:   04/23/17 0955 04/23/17 1023  BP:  (!) 164/85  Pulse:  68  Resp:  16  Temp:  36.6 C  SpO2: 97% 98%    Last Pain:  Vitals:   04/23/17 1023  TempSrc: Oral  PainSc:                  Cleda MccreedyJoseph K Piscitello

## 2017-04-23 NOTE — Anesthesia Postprocedure Evaluation (Signed)
Anesthesia Post Note  Patient: Katrina Jimenez  Procedure(s) Performed: AN AD HOC LABOR EPIDURAL  Patient location during evaluation: PACU Anesthesia Type: Epidural Level of consciousness: awake and alert Pain management: pain level controlled Vital Signs Assessment: post-procedure vital signs reviewed and stable Respiratory status: spontaneous breathing, nonlabored ventilation and respiratory function stable Cardiovascular status: stable Postop Assessment: no headache and no backache (Able to ambulate) Anesthetic complications: no     Last Vitals:  Vitals:   04/23/17 0107 04/23/17 0500  BP: (!) 153/83 (!) 147/86  Pulse: 62 64  Resp: 18 20  Temp: 36.5 C 37.1 C  SpO2: 99% 99%    Last Pain:  Vitals:   04/23/17 0500  TempSrc: Oral  PainSc:                  Katrina Jimenez

## 2017-04-23 NOTE — Progress Notes (Signed)
15 minute call to floor. 

## 2017-04-23 NOTE — Plan of Care (Signed)
Problem: Pain Managment: Goal: General experience of comfort will improve Outcome: Progressing Pain medication given as ordered. Patient complaining of right sided abd pain post-op. Liquid adhesive to lap site, clean, dry and intact with no drainage noted.  Problem: Physical Regulation: Goal: Will remain free from infection Outcome: Progressing No new signs or symptoms of infection noted  Problem: Fluid Volume: Goal: Ability to maintain a balanced intake and output will improve Outcome: Progressing Patient tolerating orals well, eating and drinking as family and visitor bought in food for patient today. Denies any nausea this shift.

## 2017-04-23 NOTE — Anesthesia Preprocedure Evaluation (Addendum)
Anesthesia Evaluation  Patient identified by MRN, date of birth, ID band Patient awake    Reviewed: Allergy & Precautions, H&P , NPO status , Patient's Chart, lab work & pertinent test results  History of Anesthesia Complications Negative for: history of anesthetic complications  Airway Mallampati: III  TM Distance: >3 FB Neck ROM: full    Dental  (+) Chipped   Pulmonary former smoker,           Cardiovascular Exercise Tolerance: Good hypertension, (-) angina(-) Past MI and (-) DOE      Neuro/Psych negative neurological ROS  negative psych ROS   GI/Hepatic negative GI ROS, Neg liver ROS, neg GERD  ,  Endo/Other  negative endocrine ROS  Renal/GU      Musculoskeletal   Abdominal   Peds  Hematology negative hematology ROS (+)   Anesthesia Other Findings   Past Surgical History: No date: CHOLECYSTECTOMY  BMI    Body Mass Index:  44.85 kg/m      Reproductive/Obstetrics negative OB ROS                            Anesthesia Physical Anesthesia Plan  ASA: III  Anesthesia Plan: General ETT   Post-op Pain Management:    Induction: Intravenous  PONV Risk Score and Plan: 3 and Ondansetron, Dexamethasone and Midazolam  Airway Management Planned: Oral ETT  Additional Equipment:   Intra-op Plan:   Post-operative Plan: Extubation in OR  Informed Consent: I have reviewed the patients History and Physical, chart, labs and discussed the procedure including the risks, benefits and alternatives for the proposed anesthesia with the patient or authorized representative who has indicated his/her understanding and acceptance.   Dental Advisory Given  Plan Discussed with: Anesthesiologist, CRNA and Surgeon  Anesthesia Plan Comments: (Patient consented for risks of anesthesia including but not limited to:  - adverse reactions to medications - damage to teeth, lips or other oral mucosa -  sore throat or hoarseness - Damage to heart, brain, lungs or loss of life  Patient voiced understanding.)        Anesthesia Quick Evaluation

## 2017-04-24 ENCOUNTER — Encounter: Payer: Self-pay | Admitting: Obstetrics & Gynecology

## 2017-04-24 LAB — CBC
HCT: 32.7 % — ABNORMAL LOW (ref 35.0–47.0)
Hemoglobin: 10.6 g/dL — ABNORMAL LOW (ref 12.0–16.0)
MCH: 26.5 pg (ref 26.0–34.0)
MCHC: 32.5 g/dL (ref 32.0–36.0)
MCV: 81.6 fL (ref 80.0–100.0)
PLATELETS: 256 10*3/uL (ref 150–440)
RBC: 4.01 MIL/uL (ref 3.80–5.20)
RDW: 15.7 % — AB (ref 11.5–14.5)
WBC: 19.4 10*3/uL — AB (ref 3.6–11.0)

## 2017-04-24 MED ORDER — IBUPROFEN 600 MG PO TABS
600.0000 mg | ORAL_TABLET | Freq: Four times a day (QID) | ORAL | Status: DC
  Administered 2017-04-24: 600 mg via ORAL
  Filled 2017-04-24: qty 1

## 2017-04-24 MED ORDER — IBUPROFEN 600 MG PO TABS: 600.0000 mg | ORAL_TABLET | Freq: Four times a day (QID) | ORAL | 0 refills | Status: DC

## 2017-04-24 MED ORDER — OXYCODONE HCL 5 MG PO TABS: 5.0000 mg | ORAL_TABLET | ORAL | 0 refills | Status: DC | PRN

## 2017-04-24 NOTE — Progress Notes (Signed)
Discharge instructions reviewed with patient.  All questions answered.

## 2017-04-24 NOTE — Discharge Instructions (Signed)

## 2017-04-26 LAB — SURGICAL PATHOLOGY

## 2018-01-02 IMAGING — US US MFM OB FOLLOW-UP
1 series · 13 of 18 positions shown · non-contrast
Comparison: none

PATIENT INFO:

PERFORMED BY:
SERVICE(S) PROVIDED:
INDICATIONS:
19 weeks gestation of pregnancy
Anatomic survey
Obesity
Advanced maternal age
FETAL EVALUATION:
Num Of Fetuses:     1
Preg. Location:     Mid
Fetal Heart         136
Rate(bpm):
Cardiac Activity:   Present
Presentation:       Vertex
Placenta:           Anterior Grade 0, No previa
Amniotic Fluid
AFI FV:      Within normal limits
GESTATIONAL AGE:
LMP:           18w 3d        Date:  08/13/16                 EDD:   05/20/17
Best:          19w 5d     Det. By:  Previous Ultrasound      EDD:   05/11/17
(10/25/16)
ANATOMY:
Face:                  Within Normal Limits   Stomach:                Seen
Lips:                  Normal appearance      Abdomen:                Cord Insertion
Within Normal Limits
Heart:                 Normal appearance      Abdominal Wall:         Normal appearance
RVOT:                  Normal appearance      Bladder:                Seen
LVOT:                  Normal appearance

[Series 1: us mfm ob follow-up · 13 of 18 slices shown]
[im 1/18]
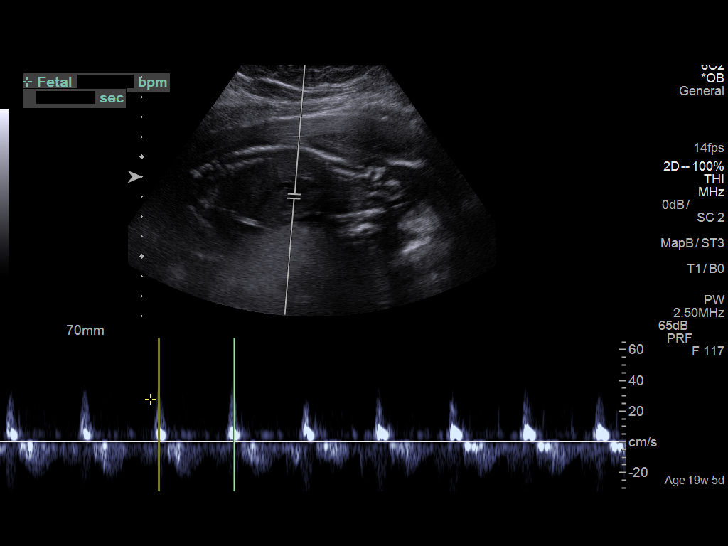
[im 3/18]
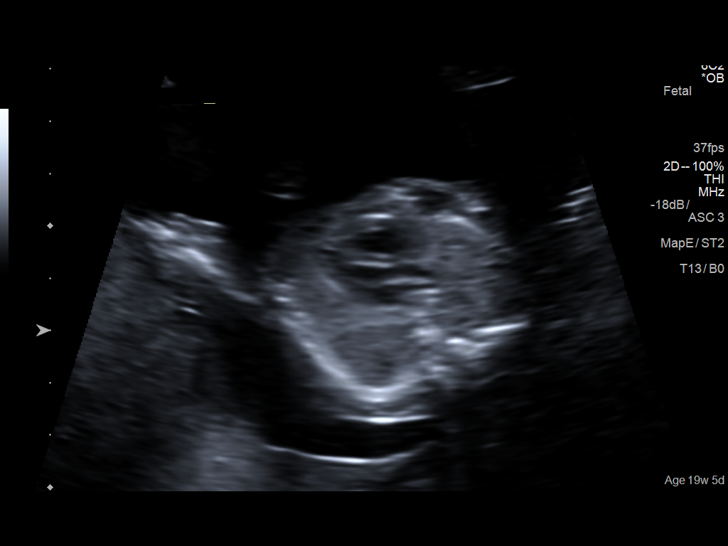
[im 4/18]
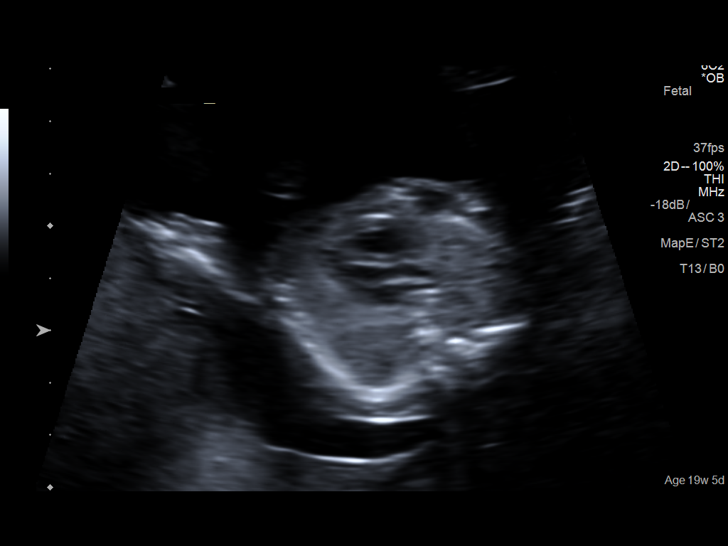
[im 5/18]
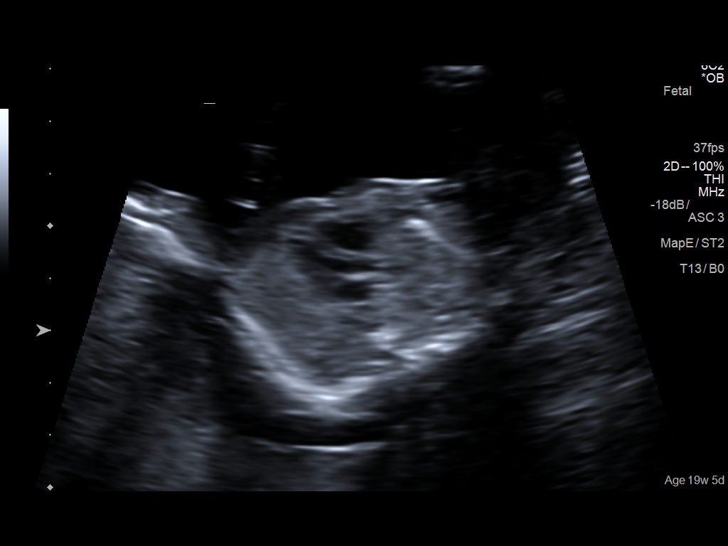
[im 7/18]
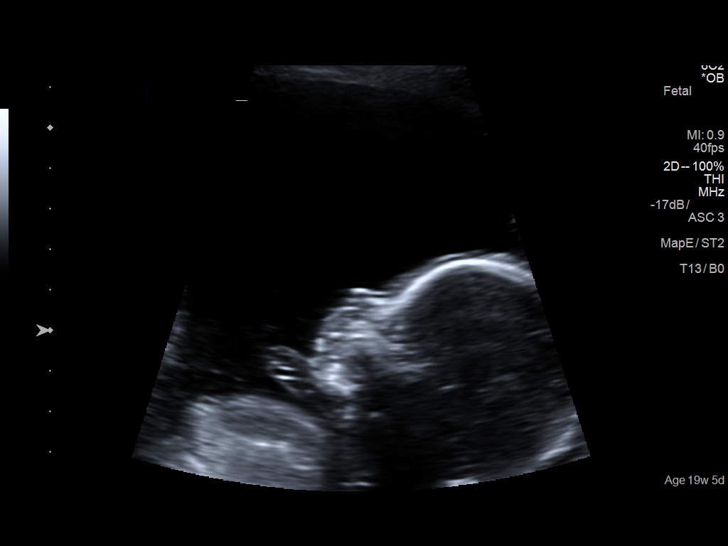
[im 8/18]
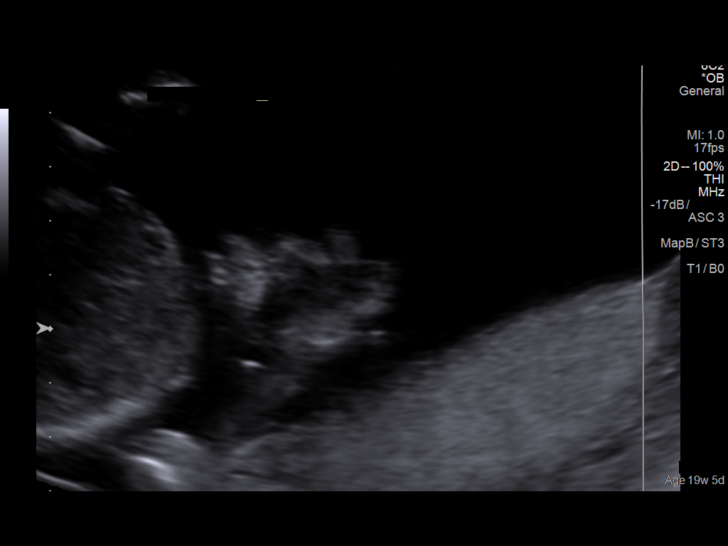
[im 10/18]
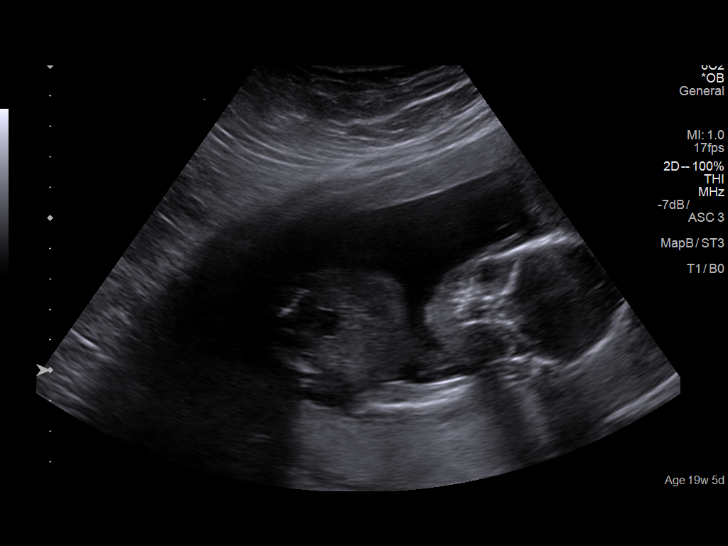
[im 11/18]
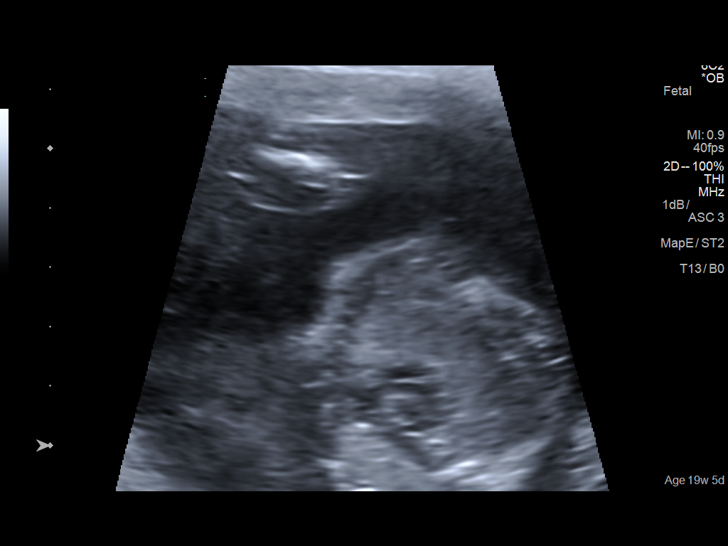
[im 12/18]
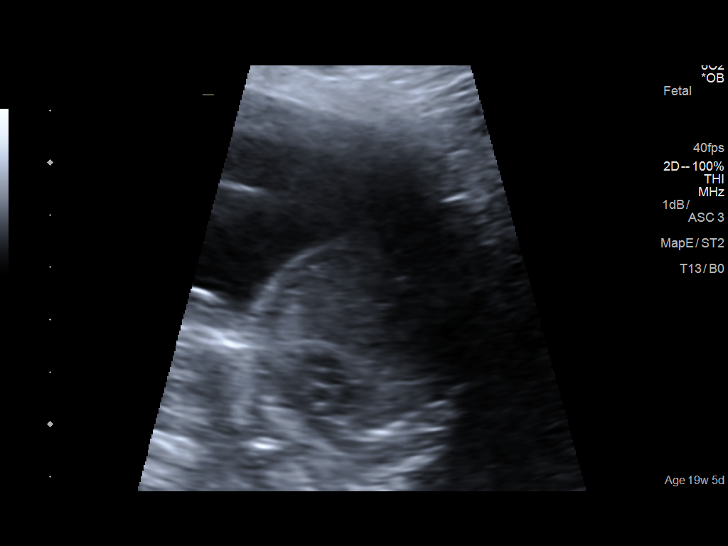
[im 14/18]
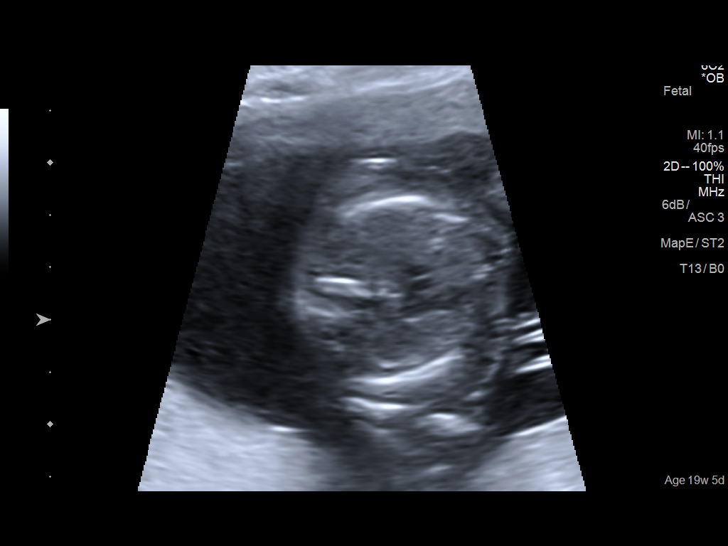
[im 15/18]
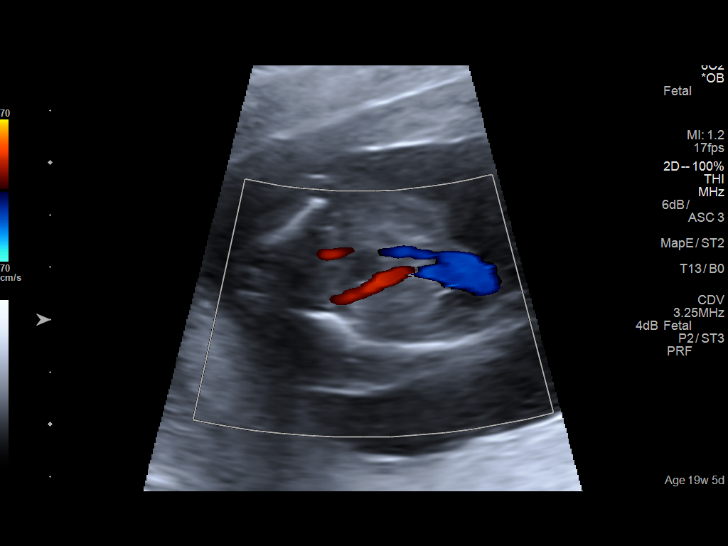
[im 16/18]
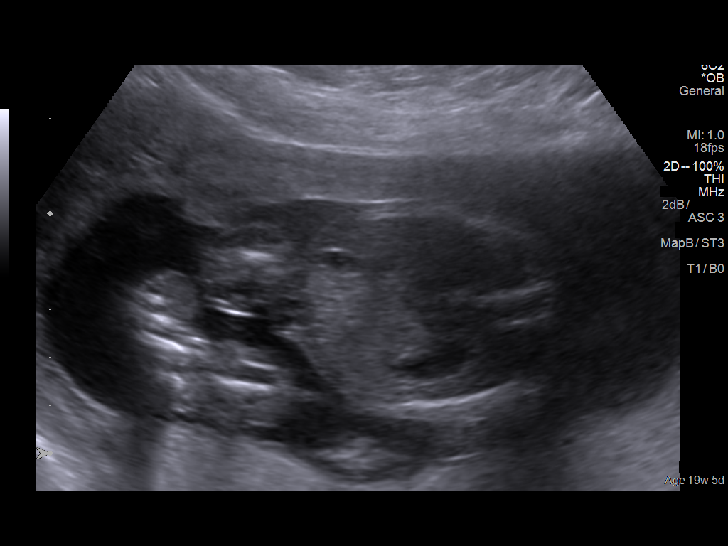
[im 18/18]
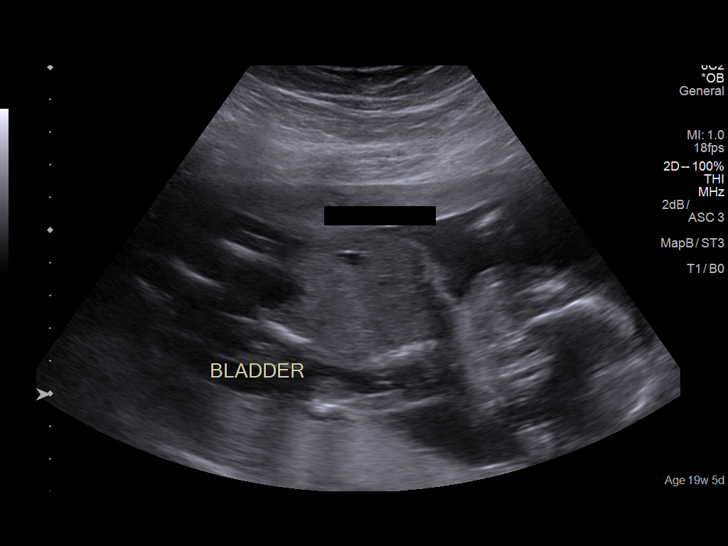

[13 of 18 positions shown; findings below may reference images not displayed]

IMPRESSION: Ms. Gustavo Ricardo returns today for completion of the fetal anatomic
survey.  She was originally referred to [facility] for
detailed ultrasound  for the indication of morbid obesity and
advanced maternal age. She was scanned on 12/06/2016 at
which time, due to early gestational age and poor maternal
acoustics, the nose/lip, outflow tracts of the heart, bicaval
view and 3 vessel trachea view were suboptimally seen.
Other fetal anatomy visualized appeared normal.  Ms. Gustavo Ricardo
previously had genetic counseling and had negative cell free
fetal DNA screening.

Today, there is an intrauterine pregnancy with a best
estimated gestational age of 19 weeks 5 days.  Dating is
based on earliest available ultrasound performed at [REDACTED] on 10/25/2016; measurements reported then as 11
weeks 5 days.  The examination was limited to the fetal heart
and face.  Views that were not visualized previously were
viewed today and appear normal.
The patient was offered and declined AFP testing today.
RECOMMENDATIONS:

We would recommend monthly ultrasounds for growth in the
third trimester.
Shawanda

## 2018-04-13 IMAGING — US US MFM OB FOLLOW-UP
1 series · 13 of 28 positions shown · non-contrast
Comparison: none

pm)
PATIENT INFO:

PERFORMED BY:
SERVICE(S) PROVIDED:
INDICATIONS:
34 weeks gestation of pregnancy
FETAL EVALUATION:
Num Of Fetuses:     1
Fetal Heart         147
Rate(bpm):
Presentation:       Breech
Placenta:           Posterior Grade [DATE], No previa
AFI Sum(cm)     %Tile       Largest Pocket(cm)
24.87           95
RUQ(cm)       RLQ(cm)       LUQ(cm)        LLQ(cm)
3.84
BIOMETRY:
BPD:      85.3  mm     G. Age:  34w 2d         55  %    CI:        72.52   %    70 - 86
FL/HC:      21.4   %    19.4 -
HC:      318.6  mm     G. Age:  35w 6d         58  %    HC/AC:      1.03        0.96 -
AC:      309.9  mm     G. Age:  34w 6d         76  %    FL/BPD:     80.0   %    71 - 87
FL:       68.2  mm     G. Age:  35w 0d         64  %    FL/AC:      22.0   %    20 - 24
HUM:      60.6  mm     G. Age:  35w 1d         78  %
Est. FW:    6390  gm    5 lb 11 oz      66  %
GESTATIONAL AGE:
LMP:           32w 6d        Date:  08/13/16                 EDD:   05/20/17
U/S Today:     35w 0d                                        EDD:   05/05/17
Best:          34w 1d     Det. By:  Previous Ultrasound      EDD:   05/11/17
(10/25/16)
ANATOMY:
Cavum:                 Visualized             Stomach:                Seen
previously
Ventricles:            Normal appearance      Abdominal Wall:         Visualized
Cerebellum:            Visualized             Cord Vessels:           3 vessels,
previously                                     visualized previously
Posterior Fossa:       Visualized             Kidneys:                Normal appearance
Face:                  Orbits visualized      Bladder:                Seen
Lips:                  Visualized             Spine:                  Appears WNL
Heart:                 4-Chamber view         Upper Extremities:      Visualized
appears normal                                 previously
RVOT:                  Seen on prior          Lower Extremities:      Visualized
LVOT:                  Seen on prior

[Series 1: us mfm ob follow-up · 0.30mm/px · 13 of 41 slices shown]
[im 2/41]
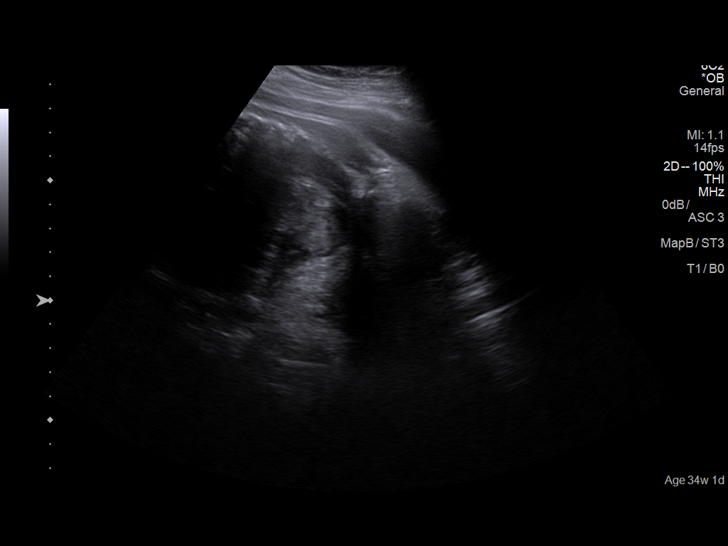
[im 5/41]
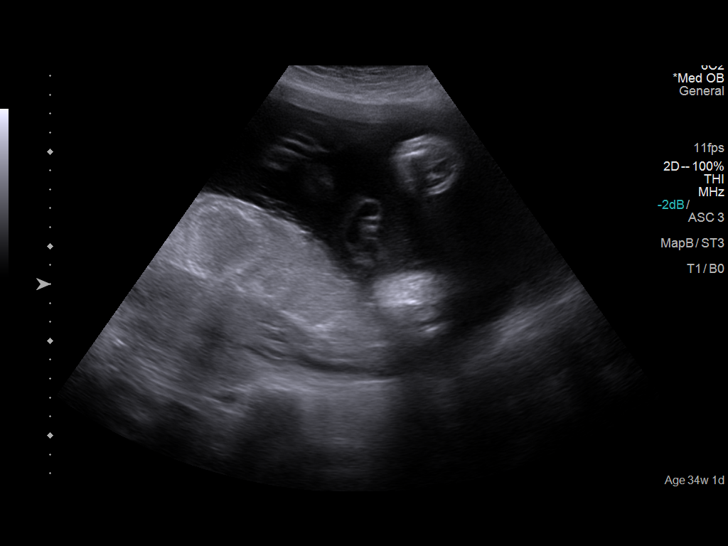
[im 8/41]
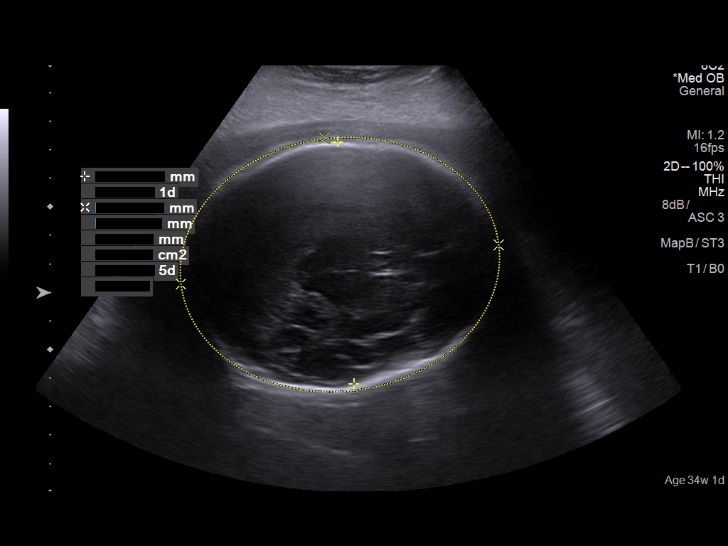
[im 11/41]
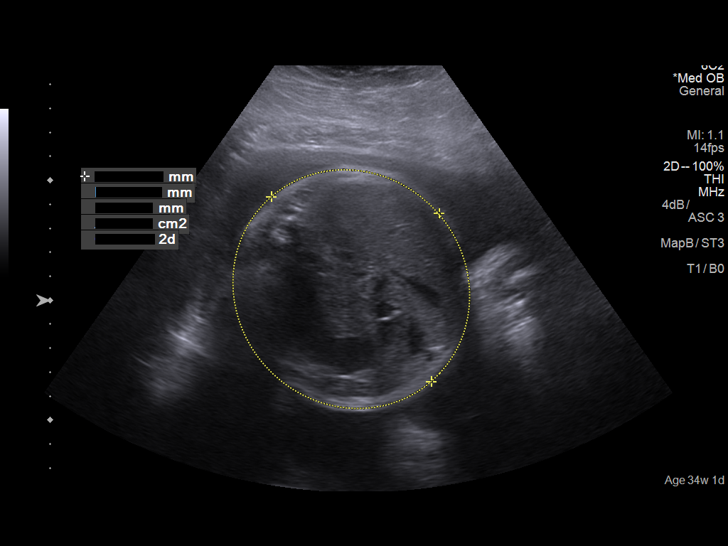
[im 14/41]
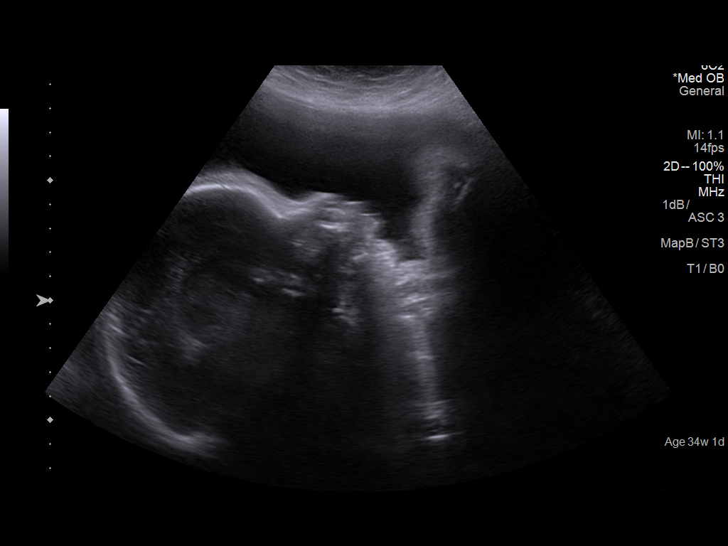
[im 17/41]
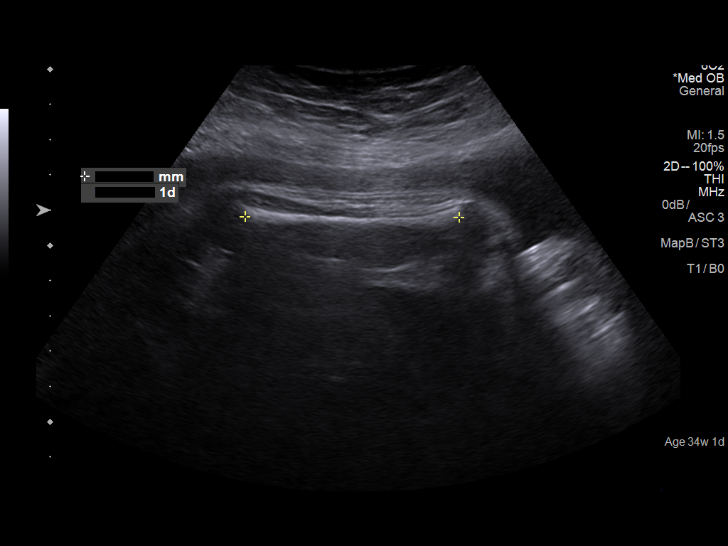
[im 21/41]
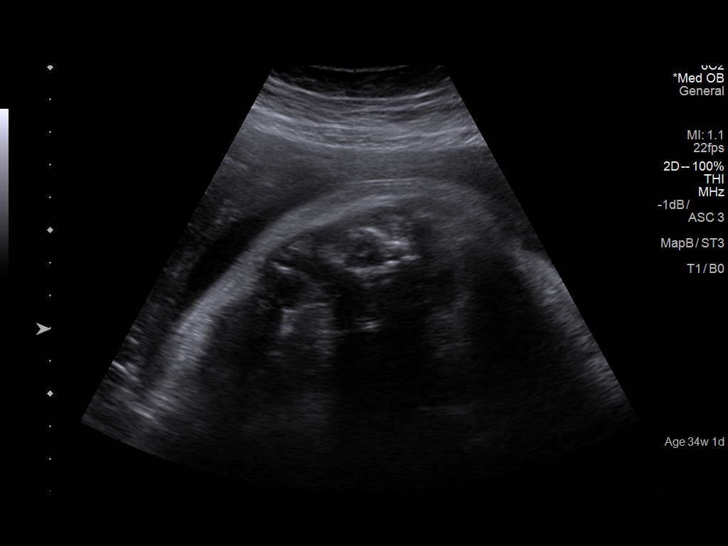
[im 24/41]
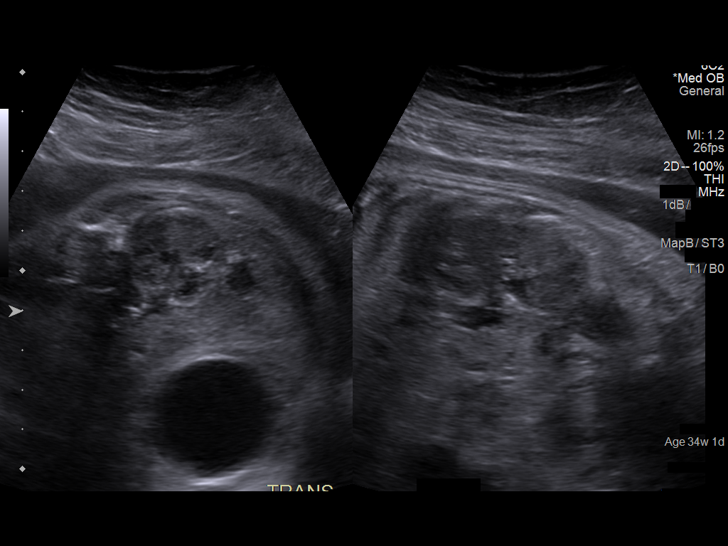
[im 27/41]
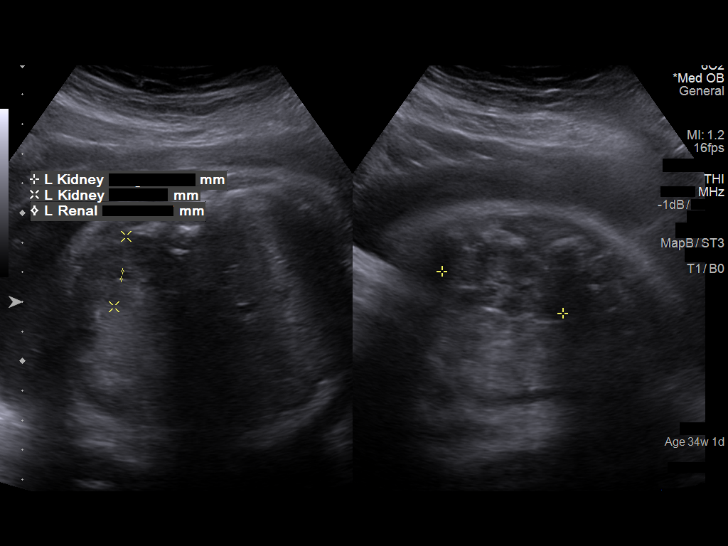
[im 30/41]
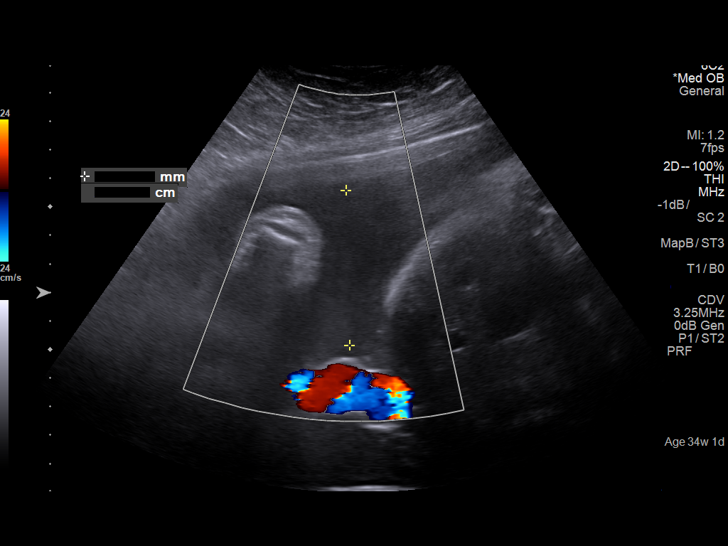
[im 33/41]
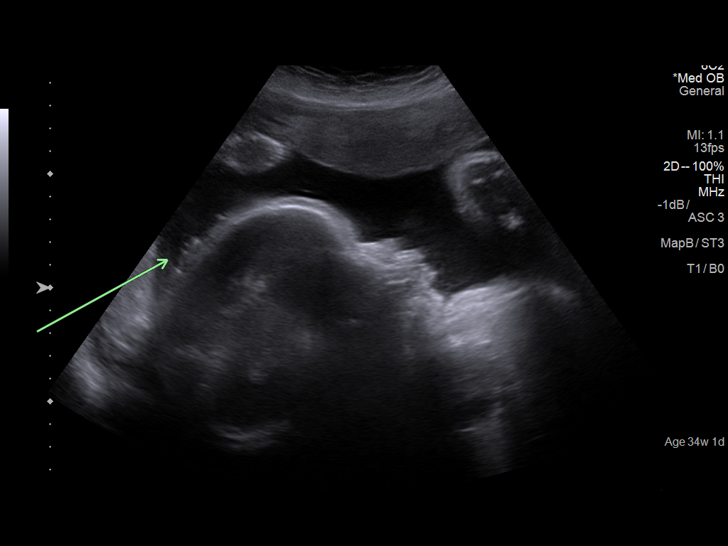
[im 36/41]
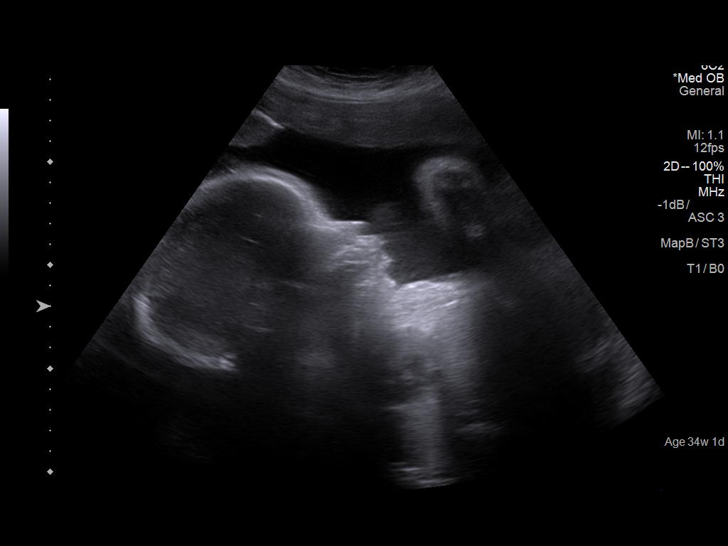
[im 39/41]
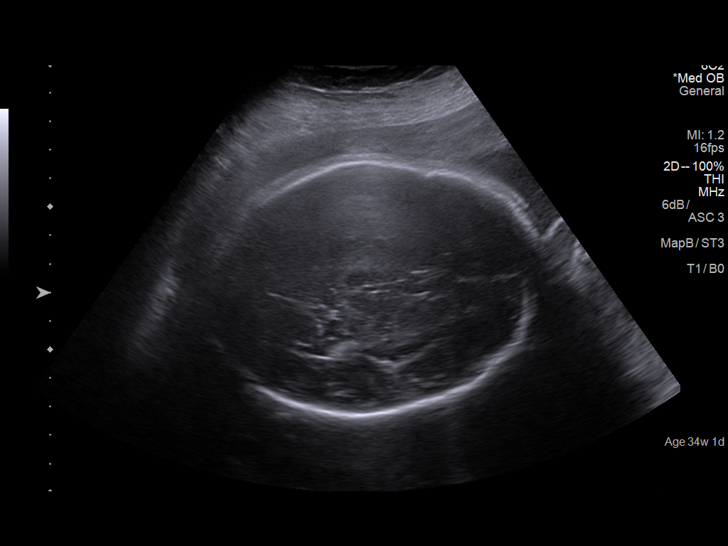

[13 of 28 positions shown; findings below may reference images not displayed]

IMPRESSION: Dear Ms. Imon,

Thank you for referring your patient  for a fetal growth
evaluation.

There is a singleton gestation with subjectively  high normal
amniotic fluid volume. AFI =24 MVP is 9.9 cm. ( While there
is a single large pocket there are extremities in the pocket
and AC touches uterine wall .)
BREECH position

The fetal biometry correlates with established dating.
Pt is at 34 w 1d based on earliest scan performed on 10/25/16
performed at [REDACTED] at 11 w 5d with EDC of 05/11/17
Adequate interval growth noted.
The estimated fetal weight is at the 66   percentile. [place] [employer].

Recommend follow-up scan for fetal growth in 3 weeks as
clinically indicated.
Consider weekly NST AFI for elevated BMI

Thank you for allowing us to participate in your patient's care.
assistance.

## 2019-06-14 ENCOUNTER — Inpatient Hospital Stay: Admission: RE | Admit: 2019-06-14 | Payer: Medicaid Other | Source: Ambulatory Visit

## 2019-06-21 ENCOUNTER — Other Ambulatory Visit: Payer: Medicaid Other

## 2019-08-06 ENCOUNTER — Encounter
Admission: RE | Admit: 2019-08-06 | Discharge: 2019-08-06 | Disposition: A | Discharge status: Home / Self Care | Payer: Managed Care, Other (non HMO) | Source: Ambulatory Visit | Attending: Obstetrics & Gynecology | Admitting: Obstetrics & Gynecology

## 2019-08-06 ENCOUNTER — Other Ambulatory Visit: Payer: Self-pay

## 2019-08-06 HISTORY — DX: Anemia, unspecified: D64.9

## 2019-08-06 HISTORY — DX: Other complications of anesthesia, initial encounter: T88.59XA

## 2019-08-06 NOTE — Patient Instructions (Signed)
Your procedure is scheduled on: 08/20/19 Report to DAY SURGERY DEPARTMENT LOCATED ON 2ND FLOOR MEDICAL MALL ENTRANCE. To find out your arrival time please call [phone] between 1PM - 3PM on 08/17/19.  Remember: Instructions that are not followed completely may result in serious medical risk, up to and including death, or upon the discretion of your surgeon and anesthesiologist your surgery may need to be rescheduled.     _X__ 1. Do not eat food after midnight the night before your procedure.                 No gum chewing or hard candies. You may drink clear liquids up to 2 hours                 before you are scheduled to arrive for your surgery- DO not drink clear                 liquids within 2 hours of the start of your surgery.                 Clear Liquids include:  water, apple juice without pulp, clear carbohydrate                 drink such as Clearfast or Gatorade, Black Coffee or Tea (Do not add                 anything to coffee or tea). Diabetics water only  ENSURE PRE SURGERY CARB DRINK NEEDS TO BE FINISHED 2 HOURS BEFORE YOUR ARRIVAL THE DAY OF SURGERY.  __X__2.  On the morning of surgery brush your teeth with toothpaste and water, you                 may rinse your mouth with mouthwash if you wish.  Do not swallow any              toothpaste of mouthwash.     _X__ 3.  No Alcohol for 24 hours before or after surgery.   _X__ 4.  Do Not Smoke or use e-cigarettes For 24 Hours Prior to Your Surgery.                 Do not use any chewable tobacco products for at least 6 hours prior to                 surgery.  ____  5.  Bring all medications with you on the day of surgery if instructed.   __X__  6.  Notify your doctor if there is any change in your medical condition      (cold, fever, infections).     Do not wear jewelry, make-up, hairpins, clips or nail polish. Do not wear lotions, powders, or perfumes.  Do not shave 48 hours prior to surgery. Men may shave face and  neck. Do not bring valuables to the hospital.    [facility] is not responsible for any belongings or valuables.  Contacts, dentures/partials or body piercings may not be worn into surgery. Bring a case for your contacts, glasses or hearing aids, a denture cup will be supplied. Leave your suitcase in the car. After surgery it may be brought to your room. For patients admitted to the hospital, discharge time is determined by your treatment team.   Patients discharged the day of surgery will not be allowed to drive home.   Please read over the following fact sheets that you were given:  MRSA Information  __X__ Take these medicines the morning of surgery with A SIP OF WATER:    1. NONE  2.   3.   4.  5.  6.  ____ Fleet Enema (as directed)   __X__ Use CHG Soap/SAGE wipes as directed  ____ Use inhalers on the day of surgery  ____ Stop metformin/Janumet/Farxiga 2 days prior to surgery    ____ Take 1/2 of usual insulin dose the night before surgery. No insulin the morning          of surgery.   ____ Stop Blood Thinners Coumadin/Plavix/Xarelto/Pleta/Pradaxa/Eliquis/Effient/Aspirin  on   Or contact your Surgeon, Cardiologist or Medical Doctor regarding  ability to stop your blood thinners  __X__ Stop Anti-inflammatories 7 days before surgery such as Advil, Ibuprofen, Motrin,  BC or Goodies Powder, Naprosyn, Naproxen, Aleve, Aspirin    __X__ Stop all herbal supplements, fish oil or vitamin E until after surgery.    ____ Bring C-Pap to the hospital.

## 2019-08-15 ENCOUNTER — Other Ambulatory Visit
Admission: RE | Admit: 2019-08-15 | Discharge: 2019-08-15 | Disposition: A | Discharge status: Home / Self Care | Payer: Managed Care, Other (non HMO) | Source: Ambulatory Visit | Attending: Obstetrics & Gynecology | Admitting: Obstetrics & Gynecology

## 2019-08-15 ENCOUNTER — Other Ambulatory Visit: Payer: Self-pay

## 2019-08-15 DIAGNOSIS — Z20822 Contact with and (suspected) exposure to covid-19: Secondary | ICD-10-CM | POA: Insufficient documentation

## 2019-08-15 DIAGNOSIS — Z01812 Encounter for preprocedural laboratory examination: Secondary | ICD-10-CM | POA: Diagnosis present

## 2019-08-15 LAB — BASIC METABOLIC PANEL
Anion gap: 8 (ref 5–15)
BUN: 16 mg/dL (ref 6–20)
CO2: 23 mmol/L (ref 22–32)
Calcium: 9.2 mg/dL (ref 8.9–10.3)
Chloride: 108 mmol/L (ref 98–111)
Creatinine, Ser: 1.11 mg/dL — ABNORMAL HIGH (ref 0.44–1.00)
GFR calc Af Amer: >60 mL/min (ref >60)
GFR calc non Af Amer: >60 mL/min (ref >60)
Glucose, Bld: 104 mg/dL — ABNORMAL HIGH (ref 70–99)
Potassium: 3.9 mmol/L (ref 3.5–5.1)
Sodium: 139 mmol/L (ref 135–145)

## 2019-08-15 LAB — CBC
HCT: 39.7 % (ref 36.0–46.0)
Hemoglobin: 12.8 g/dL (ref 12.0–15.0)
MCH: 26.8 pg (ref 26.0–34.0)
MCHC: 32.2 g/dL (ref 30.0–36.0)
MCV: 83.1 fL (ref 80.0–100.0)
Platelets: 273 10*3/uL (ref 150–400)
RBC: 4.78 MIL/uL (ref 3.87–5.11)
RDW: 14.5 % (ref 11.5–15.5)
WBC: 11.1 10*3/uL — ABNORMAL HIGH (ref 4.0–10.5)
nRBC: 0 % (ref 0.0–0.2)

## 2019-08-15 LAB — TYPE AND SCREEN
ABO/RH(D): O POS
Antibody Screen: NEGATIVE

## 2019-08-15 LAB — SARS CORONAVIRUS 2 (TAT 6-24 HRS): SARS Coronavirus 2: NEGATIVE

## 2019-08-20 ENCOUNTER — Ambulatory Visit
Admission: RE | Admit: 2019-08-20 | Discharge: 2019-08-20 | Disposition: A | Discharge status: Home / Self Care | Payer: 59 | Attending: Obstetrics & Gynecology | Admitting: Obstetrics & Gynecology

## 2019-08-20 ENCOUNTER — Other Ambulatory Visit: Payer: Self-pay

## 2019-08-20 ENCOUNTER — Encounter: Payer: Self-pay | Admitting: Obstetrics & Gynecology

## 2019-08-20 ENCOUNTER — Ambulatory Visit: Payer: 59 | Admitting: Anesthesiology

## 2019-08-20 ENCOUNTER — Encounter
Admission: RE | Disposition: A | Discharge status: Home / Self Care | Payer: Self-pay | Source: Home / Self Care | Attending: Obstetrics & Gynecology

## 2019-08-20 DIAGNOSIS — N9489 Other specified conditions associated with female genital organs and menstrual cycle: Secondary | ICD-10-CM | POA: Insufficient documentation

## 2019-08-20 DIAGNOSIS — Z8249 Family history of ischemic heart disease and other diseases of the circulatory system: Secondary | ICD-10-CM | POA: Diagnosis not present

## 2019-08-20 DIAGNOSIS — N946 Dysmenorrhea, unspecified: Secondary | ICD-10-CM | POA: Insufficient documentation

## 2019-08-20 DIAGNOSIS — F17200 Nicotine dependence, unspecified, uncomplicated: Secondary | ICD-10-CM | POA: Insufficient documentation

## 2019-08-20 DIAGNOSIS — N920 Excessive and frequent menstruation with regular cycle: Secondary | ICD-10-CM | POA: Insufficient documentation

## 2019-08-20 DIAGNOSIS — Z8349 Family history of other endocrine, nutritional and metabolic diseases: Secondary | ICD-10-CM | POA: Insufficient documentation

## 2019-08-20 DIAGNOSIS — Z791 Long term (current) use of non-steroidal anti-inflammatories (NSAID): Secondary | ICD-10-CM | POA: Diagnosis not present

## 2019-08-20 DIAGNOSIS — N736 Female pelvic peritoneal adhesions (postinfective): Secondary | ICD-10-CM | POA: Diagnosis not present

## 2019-08-20 DIAGNOSIS — Z833 Family history of diabetes mellitus: Secondary | ICD-10-CM | POA: Diagnosis not present

## 2019-08-20 HISTORY — PX: ROBOTIC ASSISTED TOTAL HYSTERECTOMY WITH BILATERAL SALPINGO OOPHERECTOMY: SHX6086

## 2019-08-20 LAB — POCT PREGNANCY, URINE: Preg Test, Ur: NEGATIVE

## 2019-08-20 LAB — ABO/RH: ABO/RH(D): O POS

## 2019-08-20 SURGERY — HYSTERECTOMY, TOTAL, ROBOT-ASSISTED, LAPAROSCOPIC, WITH BILATERAL SALPINGO-OOPHORECTOMY
Anesthesia: General

## 2019-08-20 MED ORDER — ROCURONIUM BROMIDE 50 MG/5ML IV SOLN
INTRAVENOUS | Status: AC
  Filled 2019-08-20: qty 1

## 2019-08-20 MED ORDER — FAMOTIDINE 20 MG PO TABS: 20.0000 mg | ORAL_TABLET | Freq: Once | ORAL | Status: AC

## 2019-08-20 MED ORDER — IBUPROFEN 600 MG PO TABS: 600.0000 mg | ORAL_TABLET | Freq: Four times a day (QID) | ORAL | 1 refills | Status: DC

## 2019-08-20 MED ORDER — PROPOFOL 10 MG/ML IV BOLUS
INTRAVENOUS | Status: DC | PRN
  Administered 2019-08-20: 50 mg via INTRAVENOUS
  Administered 2019-08-20: 150 mg via INTRAVENOUS

## 2019-08-20 MED ORDER — FENTANYL CITRATE (PF) 100 MCG/2ML IJ SOLN
INTRAMUSCULAR | Status: AC
  Filled 2019-08-20: qty 2

## 2019-08-20 MED ORDER — FENTANYL CITRATE (PF) 100 MCG/2ML IJ SOLN
25.0000 ug | INTRAMUSCULAR | Status: DC | PRN
  Administered 2019-08-20: 12:00:00 25 ug via INTRAVENOUS

## 2019-08-20 MED ORDER — ROCURONIUM BROMIDE 100 MG/10ML IV SOLN
INTRAVENOUS | Status: DC | PRN
  Administered 2019-08-20: 20 mg via INTRAVENOUS
  Administered 2019-08-20: 10 mg via INTRAVENOUS
  Administered 2019-08-20: 30 mg via INTRAVENOUS
  Administered 2019-08-20: 20 mg via INTRAVENOUS
  Administered 2019-08-20: 10 mg via INTRAVENOUS

## 2019-08-20 MED ORDER — LIDOCAINE HCL (CARDIAC) PF 100 MG/5ML IV SOSY
PREFILLED_SYRINGE | INTRAVENOUS | Status: DC | PRN
  Administered 2019-08-20: 80 mg via INTRAVENOUS

## 2019-08-20 MED ORDER — OXYCODONE HCL 5 MG PO TABS: 5.0000 mg | ORAL_TABLET | ORAL | 0 refills | Status: AC | PRN

## 2019-08-20 MED ORDER — CEFAZOLIN SODIUM-DEXTROSE 2-4 GM/100ML-% IV SOLN
INTRAVENOUS | Status: AC
  Filled 2019-08-20: qty 100

## 2019-08-20 MED ORDER — LACTATED RINGERS IV SOLN: INTRAVENOUS | Status: DC

## 2019-08-20 MED ORDER — BUPIVACAINE HCL (PF) 0.5 % IJ SOLN
INTRAMUSCULAR | Status: AC
  Filled 2019-08-20: qty 30

## 2019-08-20 MED ORDER — KETOROLAC TROMETHAMINE 15 MG/ML IJ SOLN: 15.0000 mg | INTRAMUSCULAR | Status: AC

## 2019-08-20 MED ORDER — DEXAMETHASONE SODIUM PHOSPHATE 10 MG/ML IJ SOLN: 4.0000 mg | INTRAMUSCULAR | Status: AC

## 2019-08-20 MED ORDER — FENTANYL CITRATE (PF) 100 MCG/2ML IJ SOLN
25.0000 ug | INTRAMUSCULAR | Status: DC | PRN
  Administered 2019-08-20 (×5): 25 ug via INTRAVENOUS

## 2019-08-20 MED ORDER — ONDANSETRON HCL 4 MG/2ML IJ SOLN
INTRAMUSCULAR | Status: DC | PRN
  Administered 2019-08-20: 4 mg via INTRAVENOUS

## 2019-08-20 MED ORDER — ONDANSETRON HCL 4 MG/2ML IJ SOLN
INTRAMUSCULAR | Status: AC
  Filled 2019-08-20: qty 2

## 2019-08-20 MED ORDER — GABAPENTIN 300 MG PO CAPS: 600.0000 mg | ORAL_CAPSULE | ORAL | Status: AC

## 2019-08-20 MED ORDER — LIDOCAINE HCL (PF) 2 % IJ SOLN
INTRAMUSCULAR | Status: AC
  Filled 2019-08-20: qty 10

## 2019-08-20 MED ORDER — SUGAMMADEX SODIUM 200 MG/2ML IV SOLN
INTRAVENOUS | Status: DC | PRN
  Administered 2019-08-20: 100 mg via INTRAVENOUS
  Administered 2019-08-20: 200 mg via INTRAVENOUS

## 2019-08-20 MED ORDER — DEXAMETHASONE SODIUM PHOSPHATE 10 MG/ML IJ SOLN
INTRAMUSCULAR | Status: AC
  Filled 2019-08-20: qty 1

## 2019-08-20 MED ORDER — ACETAMINOPHEN 500 MG PO TABS: 1000.0000 mg | ORAL_TABLET | ORAL | Status: AC

## 2019-08-20 MED ORDER — FENTANYL CITRATE (PF) 100 MCG/2ML IJ SOLN
INTRAMUSCULAR | Status: DC | PRN
  Administered 2019-08-20: 25 ug via INTRAVENOUS
  Administered 2019-08-20: 50 ug via INTRAVENOUS
  Administered 2019-08-20: 25 ug via INTRAVENOUS
  Administered 2019-08-20: 50 ug via INTRAVENOUS

## 2019-08-20 MED ORDER — ACETAMINOPHEN 500 MG PO TABS
ORAL_TABLET | ORAL | Status: AC
  Administered 2019-08-20: 07:00:00 1000 mg via ORAL
  Filled 2019-08-20: qty 2

## 2019-08-20 MED ORDER — FENTANYL CITRATE (PF) 100 MCG/2ML IJ SOLN
INTRAMUSCULAR | Status: AC
  Administered 2019-08-20: 12:00:00 25 ug via INTRAVENOUS
  Filled 2019-08-20: qty 2

## 2019-08-20 MED ORDER — MIDAZOLAM HCL 2 MG/2ML IJ SOLN
INTRAMUSCULAR | Status: AC
  Filled 2019-08-20: qty 2

## 2019-08-20 MED ORDER — ONDANSETRON HCL 4 MG/2ML IJ SOLN
4.0000 mg | Freq: Once | INTRAMUSCULAR | Status: AC | PRN
  Administered 2019-08-20: 13:00:00 4 mg via INTRAVENOUS

## 2019-08-20 MED ORDER — HEPARIN SODIUM (PORCINE) 5000 UNIT/ML IJ SOLN
INTRAMUSCULAR | Status: AC
  Administered 2019-08-20: 07:00:00 5000 [IU] via SUBCUTANEOUS
  Filled 2019-08-20: qty 1

## 2019-08-20 MED ORDER — DEXAMETHASONE SODIUM PHOSPHATE 10 MG/ML IJ SOLN
INTRAMUSCULAR | Status: AC
  Administered 2019-08-20: 4 mg via INTRAVENOUS
  Filled 2019-08-20: qty 1

## 2019-08-20 MED ORDER — IBUPROFEN 600 MG PO TABS: 600.0000 mg | ORAL_TABLET | Freq: Four times a day (QID) | ORAL | Status: DC

## 2019-08-20 MED ORDER — OXYCODONE HCL 5 MG PO TABS
ORAL_TABLET | ORAL | Status: AC
  Administered 2019-08-20: 13:00:00 5 mg via ORAL
  Filled 2019-08-20: qty 1

## 2019-08-20 MED ORDER — KETOROLAC TROMETHAMINE 15 MG/ML IJ SOLN
INTRAMUSCULAR | Status: AC
  Administered 2019-08-20: 07:00:00 15 mg via INTRAVENOUS
  Filled 2019-08-20: qty 1

## 2019-08-20 MED ORDER — ENSURE PRE-SURGERY PO LIQD
296.0000 mL | Freq: Once | ORAL | Status: DC
  Filled 2019-08-20: qty 296

## 2019-08-20 MED ORDER — SEVOFLURANE IN SOLN
RESPIRATORY_TRACT | Status: AC
  Filled 2019-08-20: qty 250

## 2019-08-20 MED ORDER — ACETAMINOPHEN 325 MG PO TABS: 650.0000 mg | ORAL_TABLET | ORAL | Status: DC | PRN

## 2019-08-20 MED ORDER — SILVER NITRATE-POT NITRATE 75-25 % EX MISC
CUTANEOUS | Status: AC
  Filled 2019-08-20: qty 10

## 2019-08-20 MED ORDER — SCOPOLAMINE 1 MG/3DAYS TD PT72: 1.0000 | MEDICATED_PATCH | TRANSDERMAL | Status: DC

## 2019-08-20 MED ORDER — SCOPOLAMINE 1 MG/3DAYS TD PT72
MEDICATED_PATCH | TRANSDERMAL | Status: AC
  Administered 2019-08-20: 07:00:00 1.5 mg via TRANSDERMAL
  Filled 2019-08-20: qty 1

## 2019-08-20 MED ORDER — FAMOTIDINE 20 MG PO TABS
ORAL_TABLET | ORAL | Status: AC
  Administered 2019-08-20: 20 mg via ORAL
  Filled 2019-08-20: qty 1

## 2019-08-20 MED ORDER — DEXAMETHASONE SODIUM PHOSPHATE 10 MG/ML IJ SOLN
INTRAMUSCULAR | Status: DC | PRN
  Administered 2019-08-20: 10 mg via INTRAVENOUS

## 2019-08-20 MED ORDER — IBUPROFEN 600 MG PO TABS: 600.0000 mg | ORAL_TABLET | Freq: Four times a day (QID) | ORAL | 1 refills | Status: AC

## 2019-08-20 MED ORDER — HEPARIN SODIUM (PORCINE) 5000 UNIT/ML IJ SOLN: 5000.0000 [IU] | INTRAMUSCULAR | Status: AC

## 2019-08-20 MED ORDER — SUGAMMADEX SODIUM 200 MG/2ML IV SOLN
INTRAVENOUS | Status: AC
  Filled 2019-08-20: qty 2

## 2019-08-20 MED ORDER — GABAPENTIN 300 MG PO CAPS
ORAL_CAPSULE | ORAL | Status: AC
  Administered 2019-08-20: 600 mg via ORAL
  Filled 2019-08-20: qty 2

## 2019-08-20 MED ORDER — IBUPROFEN 600 MG PO TABS
ORAL_TABLET | ORAL | Status: AC
  Administered 2019-08-20: 600 mg
  Filled 2019-08-20: qty 1

## 2019-08-20 MED ORDER — MIDAZOLAM HCL 2 MG/2ML IJ SOLN
INTRAMUSCULAR | Status: DC | PRN
  Administered 2019-08-20: 2 mg via INTRAVENOUS

## 2019-08-20 MED ORDER — ACETAMINOPHEN 650 MG RE SUPP
650.0000 mg | RECTAL | Status: DC | PRN
  Filled 2019-08-20: qty 1

## 2019-08-20 MED ORDER — PROPOFOL 10 MG/ML IV BOLUS
INTRAVENOUS | Status: AC
  Filled 2019-08-20: qty 20

## 2019-08-20 MED ORDER — CEFAZOLIN SODIUM-DEXTROSE 2-4 GM/100ML-% IV SOLN
2.0000 g | INTRAVENOUS | Status: AC
  Administered 2019-08-20: 08:00:00 2 g via INTRAVENOUS

## 2019-08-20 MED ORDER — BUPIVACAINE HCL (PF) 0.5 % IJ SOLN
INTRAMUSCULAR | Status: DC | PRN
  Administered 2019-08-20: 20 mL

## 2019-08-20 MED ORDER — OXYCODONE HCL 5 MG PO TABS: 5.0000 mg | ORAL_TABLET | Freq: Once | ORAL | Status: AC

## 2019-08-20 SURGICAL SUPPLY — 70 items
ANCHOR TIS RET SYS 1550ML (BAG) ×3 IMPLANT
BAG URINE DRAIN 2000ML AR STRL (UROLOGICAL SUPPLIES) ×3 IMPLANT
BLADE SURG SZ11 CARB STEEL (BLADE) ×6 IMPLANT
CANISTER SUCT 1200ML W/VALVE (MISCELLANEOUS) ×3 IMPLANT
CATH FOLEY 2WAY  5CC 16FR (CATHETERS) ×2
CATH URTH 16FR FL 2W BLN LF (CATHETERS) ×1 IMPLANT
CHLORAPREP W/TINT 26 (MISCELLANEOUS) ×3 IMPLANT
COUNTER NEEDLE 20/40 LG (NEEDLE) ×3 IMPLANT
COVER TIP SHEARS 8 DVNC (MISCELLANEOUS) ×1 IMPLANT
COVER TIP SHEARS 8MM DA VINCI (MISCELLANEOUS) ×2
COVER WAND RF STERILE (DRAPES) ×3 IMPLANT
DEFOGGER SCOPE WARMER CLEARIFY (MISCELLANEOUS) ×3 IMPLANT
DERMABOND ADVANCED (GAUZE/BANDAGES/DRESSINGS) ×2
DERMABOND ADVANCED .7 DNX12 (GAUZE/BANDAGES/DRESSINGS) ×1 IMPLANT
DRAPE 3/4 80X56 (DRAPES) ×9 IMPLANT
DRAPE ARM DVNC X/XI (DISPOSABLE) ×4 IMPLANT
DRAPE COLUMN DVNC XI (DISPOSABLE) ×1 IMPLANT
DRAPE DA VINCI XI ARM (DISPOSABLE) ×8
DRAPE DA VINCI XI COLUMN (DISPOSABLE) ×2
DRAPE LEGGINS SURG 28X43 STRL (DRAPES) ×3 IMPLANT
DRAPE UNDER BUTTOCK W/FLU (DRAPES) ×3 IMPLANT
DRIVER NDL 23- D MEGA 49.7X1.4 (INSTRUMENTS) IMPLANT
DRIVER NDLE LRG 8 DVNC XI (INSTRUMENTS) IMPLANT
DRIVER NEEDLE XI 8MM LRG DVNC (INSTRUMENTS)
DRVR NDL 23- D MEGA 49.7X1.4 (INSTRUMENTS)
ELECT REM PT RETURN 9FT ADLT (ELECTROSURGICAL) ×3
ELECTRODE REM PT RTRN 9FT ADLT (ELECTROSURGICAL) ×1 IMPLANT
GLOVE SURG SYN 6.5 ES PF (GLOVE) ×12 IMPLANT
GOWN STRL REUS W/ TWL LRG LVL3 (GOWN DISPOSABLE) ×4 IMPLANT
GOWN STRL REUS W/TWL LRG LVL3 (GOWN DISPOSABLE) ×8
HANDLE YANKAUER SUCT BULB TIP (MISCELLANEOUS) ×3 IMPLANT
IRRIGATION STRYKERFLOW (MISCELLANEOUS) IMPLANT
IRRIGATOR STRYKERFLOW (MISCELLANEOUS)
IV NS 1000ML (IV SOLUTION)
IV NS 1000ML BAXH (IV SOLUTION) IMPLANT
KIT PINK PAD W/HEAD ARE REST (MISCELLANEOUS) ×3
KIT PINK PAD W/HEAD ARM REST (MISCELLANEOUS) ×1 IMPLANT
LABEL OR SOLS (LABEL) ×3 IMPLANT
MANIPULATOR VCARE LG CRV RETR (MISCELLANEOUS) IMPLANT
MANIPULATOR VCARE SML CRV RETR (MISCELLANEOUS) IMPLANT
MANIPULATOR VCARE STD CRV RETR (MISCELLANEOUS) IMPLANT
NEEDLE HYPO 22GX1.5 SAFETY (NEEDLE) ×3 IMPLANT
NEEDLE VERESS 14GA 120MM (NEEDLE) ×3 IMPLANT
NS IRRIG 1000ML POUR BTL (IV SOLUTION) ×3 IMPLANT
OBTURATOR OPTICAL STANDARD 8MM (TROCAR) ×2
OBTURATOR OPTICAL STND 8 DVNC (TROCAR) ×1
OBTURATOR OPTICALSTD 8 DVNC (TROCAR) ×1 IMPLANT
PACK LAP CHOLECYSTECTOMY (MISCELLANEOUS) ×3 IMPLANT
PAD OB MATERNITY 4.3X12.25 (PERSONAL CARE ITEMS) ×3 IMPLANT
PAD PREP 24X41 OB/GYN DISP (PERSONAL CARE ITEMS) ×3 IMPLANT
PENCIL ELECTRO HAND CTR (MISCELLANEOUS) IMPLANT
RETRACTOR WOUND ALXS 18CM SML (MISCELLANEOUS) ×1 IMPLANT
RTRCTR WOUND ALEXIS O 18CM SML (MISCELLANEOUS) ×3
SEAL CANN UNIV 5-8 DVNC XI (MISCELLANEOUS) ×3 IMPLANT
SEAL XI 5MM-8MM UNIVERSAL (MISCELLANEOUS) ×6
SEALER VESSEL DA VINCI XI (MISCELLANEOUS)
SEALER VESSEL EXT DVNC XI (MISCELLANEOUS) IMPLANT
SET CYSTO W/LG BORE CLAMP LF (SET/KITS/TRAYS/PACK) IMPLANT
SET TUBE SMOKE EVAC HIGH FLOW (TUBING) ×3 IMPLANT
SOLUTION ELECTROLUBE (MISCELLANEOUS) ×3 IMPLANT
SUT CUT NEEDLE DRIVER (INSTRUMENTS)
SUT DVC VLOC 180 0 12IN GS21 (SUTURE) ×3
SUT MNCRL 4-0 (SUTURE) ×4
SUT MNCRL 4-0 27XMFL (SUTURE) ×2
SUT VIC AB 0 CT1 36 (SUTURE) ×6 IMPLANT
SUT VICRYL 0 AB UR-6 (SUTURE) ×3 IMPLANT
SUTURE DVC VLC 180 0 12IN GS21 (SUTURE) ×1 IMPLANT
SUTURE MNCRL 4-0 27XMF (SUTURE) ×2 IMPLANT
SYR 10ML LL (SYRINGE) ×3 IMPLANT
TUBING EVAC SMOKE HEATED PNEUM (TUBING) IMPLANT

## 2019-08-20 NOTE — Transfer of Care (Signed)
Immediate Anesthesia Transfer of Care Note  Patient: Katrina Jimenez  Procedure(s) Performed: XI ROBOTIC ASSISTED TOTAL HYSTERECTOMY (N/A )  Patient Location: PACU  Anesthesia Type:General  Level of Consciousness: sedated  Airway & Oxygen Therapy: Patient Spontanous Breathing and Patient connected to face mask oxygen  Post-op Assessment: Report given to RN and Post -op Vital signs reviewed and stable  Post vital signs: Reviewed and stable  Last Vitals:  Vitals Value Taken Time  BP 138/80 08/20/19 1109  Temp    Pulse 64 08/20/19 1109  Resp 14 08/20/19 1111  SpO2 100 % 08/20/19 1109  Vitals shown include unvalidated device data.  Last Pain:  Vitals:   08/20/19 0611  TempSrc: Tympanic  PainSc: 2          Complications: No apparent anesthesia complications

## 2019-08-20 NOTE — Discharge Instructions (Addendum)
Discharge instructions:  Call office if you have any of the following: fever >101 F, chills, shortness of breath, excessive vaginal bleeding, incision drainage or problems, leg pain or redness, or any other concerns.   Activity: Do not lift > 20 lbs for 6 weeks.  No intercourse or tampons for 2 weeks.  No driving on narcotics and until you are certain you can slam on the brakes.   You may feel some pain in your upper right abdomen/rib and right shoulder.  This is from the gas in the abdomen for surgery. This will subside over time, please be patient!  Take 600mg  Ibuprofen and 1000mg  Tylenol around the clock, every 6 hours for at least the first 3-5 days.  After this you can take as needed.  This will help decrease inflammation and promote healing.  The narcotics you'll take just as needed, as they just trick your brain into thinking its not in pain.    Please don't limit yourself in terms of routine activity.  You will be able to do most things, although they may take longer to do or be a little painful.  You can do it!  Don't be a hero, but don't be a wimp either!      Total Laparoscopic Hysterectomy, Care After This sheet gives you information about how to care for yourself after your procedure. Your health care provider may also give you more specific instructions. If you have problems or questions, contact your health care provider. What can I expect after the procedure? After the procedure, it is common to have:  Pain and bruising around your incisions.  A sore throat, if a breathing tube was used during surgery.  Fatigue.  Poor appetite.  Less interest in sex. If your ovaries were also removed, it is also common to have symptoms of menopause such as hot flashes, night sweats, and lack of sleep (insomnia). Follow these instructions at home: Bathing  Do not take baths, swim, or use a hot tub until your health care provider approves. You may need to only take showers for 2-3  weeks.  Keep your bandage (dressing) dry until your health care provider says it can be removed. Incision care   Follow instructions from your health care provider about how to take care of your incisions. Make sure you: ? Wash your hands with soap and water before you change your dressing. If soap and water are not available, use hand sanitizer. ? Change your dressing as told by your health care provider. ? Leave stitches (sutures), skin glue, or adhesive strips in place. These skin closures may need to stay in place for 2 weeks or longer. If adhesive strip edges start to loosen and curl up, you may trim the loose edges. Do not remove adhesive strips completely unless your health care provider tells you to do that.  Check your incision area every day for signs of infection. Check for: ? Redness, swelling, or pain. ? Fluid or blood. ? Warmth. ? Pus or a bad smell. Activity  Get plenty of rest and sleep.  Do not lift anything that is heavier than 10 lbs (4.5 kg) for one month after surgery, or as long as told by your health care provider.  Do not drive or use heavy machinery while taking prescription pain medicine.  Do not drive for 24 hours if you were given a medicine to help you relax (sedative).  Return to your normal activities as told by your health care provider. Ask  your health care provider what activities are safe for you. Lifestyle   Do not use any products that contain nicotine or tobacco, such as cigarettes and e-cigarettes. These can delay healing. If you need help quitting, ask your health care provider.  Do not drink alcohol until your health care provider approves. General instructions  Do not douche, use tampons, or have sex for at least 6 weeks, or as told by your health care provider.  Take over-the-counter and prescription medicines only as told by your health care provider.  To monitor yourself for a fever, take your temperature at least once a day during  recovery.  If you struggle with physical or emotional changes after your procedure, speak with your health care provider or a therapist.  To prevent or treat constipation while you are taking prescription pain medicine, your health care provider may recommend that you: ? Drink enough fluid to keep your urine clear or pale yellow. ? Take over-the-counter or prescription medicines. ? Eat foods that are high in fiber, such as fresh fruits and vegetables, whole grains, and beans. ? Limit foods that are high in fat and processed sugars, such as fried and sweet foods.  Keep all follow-up visits as told by your health care provider. This is important. Contact a health care provider if:  You have chills or a fever.  You have redness, swelling, or pain around an incision.  You have fluid or blood coming from an incision.  Your incision feels warm to the touch.  You have pus or a bad smell coming from an incision.  An incision breaks open.  You feel dizzy or light-headed.  You have pain or bleeding when you urinate.  You have diarrhea, nausea, or vomiting that does not go away.  You have abnormal vaginal discharge.  You have a rash.  You have pain that does not get better with medicine. Get help right away if:  You have a fever and your symptoms suddenly get worse.  You have severe abdominal pain.  You have chest pain.  You have shortness of breath.  You faint.  You have pain, swelling, or redness on your leg.  You have heavy vaginal bleeding with blood clots. Summary  After the procedure it is common to have abdominal pain. Your provider will give you medication for this.  Do not take baths, swim, or use a hot tub until your health care provider approves.  Do not lift anything that is heavier than 10 lbs (4.5 kg) for one month after surgery, or as long as told by your health care provider.  Notify your provider if you have any signs or symptoms of infection after the  procedure. This information is not intended to replace advice given to you by your health care provider. Make sure you discuss any questions you have with your health care provider. Document Revised: 05/27/2017 Document Reviewed: 08/25/2016 Elsevier Patient Education  2020 [employer]    AMBULATORY SURGERY  DISCHARGE INSTRUCTIONS   1) The drugs that you were given will stay in your system until tomorrow so for the next 24 hours you should not:  A) Drive an automobile B) Make any legal decisions C) Drink any alcoholic beverage   2) You may resume regular meals tomorrow.  Today it is better to start with liquids and gradually work up to solid foods.  You may eat anything you prefer, but it is better to start with liquids, then soup and crackers, and gradually work  up to solid foods.   3) Please notify your doctor immediately if you have any unusual bleeding, trouble breathing, redness and pain at the surgery site, drainage, fever, or pain not relieved by medication.    Additional Instructions:    PER DR. WARD, BOTH IBUPROFEN AND OXYCODONE SCRIPTS WERE SENT TO YOUR PHARMACY.       Please contact your physician with any problems or Same Day Surgery at [phone], Monday through Friday 6 am to 4 pm, or [place] at [facility] number at [phone].

## 2019-08-20 NOTE — Anesthesia Procedure Notes (Signed)
Procedure Name: Intubation Date/Time: 08/20/2019 7:45 AM Performed by: Henrietta Hoover, CRNA Pre-anesthesia Checklist: Patient identified, Patient being monitored, Timeout performed, Emergency Drugs available and Suction available Patient Re-evaluated:Patient Re-evaluated prior to induction Oxygen Delivery Method: Circle system utilized Preoxygenation: Pre-oxygenation with 100% oxygen Induction Type: IV induction Ventilation: Mask ventilation without difficulty Laryngoscope Size: 3 and McGraph Grade View: Grade I Tube type: Oral Tube size: 7.0 mm Number of attempts: 1 Airway Equipment and Method: Stylet Placement Confirmation: ETT inserted through vocal cords under direct vision,  positive ETCO2 and breath sounds checked- equal and bilateral Secured at: 21 cm Tube secured with: Tape Dental Injury: Teeth and Oropharynx as per pre-operative assessment

## 2019-08-20 NOTE — Anesthesia Preprocedure Evaluation (Signed)
Anesthesia Evaluation  Patient identified by MRN, date of birth, ID band Patient awake    Reviewed: Allergy & Precautions, H&P , NPO status , Patient's Chart, lab work & pertinent test results  History of Anesthesia Complications (+) PROLONGED EMERGENCE and history of anesthetic complications  Airway Mallampati: III  TM Distance: >3 FB Neck ROM: full    Dental  (+) Chipped   Pulmonary Current Smoker and Patient abstained from smoking.,           Cardiovascular Exercise Tolerance: Good hypertension, (-) angina(-) Past MI and (-) DOE      Neuro/Psych negative neurological ROS  negative psych ROS   GI/Hepatic negative GI ROS, Neg liver ROS, neg GERD  ,  Endo/Other  negative endocrine ROS  Renal/GU      Musculoskeletal   Abdominal   Peds  Hematology negative hematology ROS (+) anemia ,   Anesthesia Other Findings   Past Surgical History: No date: CHOLECYSTECTOMY  BMI    Body Mass Index:  44.85 kg/m      Reproductive/Obstetrics negative OB ROS                             Anesthesia Physical  Anesthesia Plan  ASA: II  Anesthesia Plan: General ETT   Post-op Pain Management:    Induction: Intravenous  PONV Risk Score and Plan: 3 and Ondansetron, Dexamethasone and Midazolam  Airway Management Planned: Oral ETT  Additional Equipment:   Intra-op Plan:   Post-operative Plan: Extubation in OR  Informed Consent: I have reviewed the patients History and Physical, chart, labs and discussed the procedure including the risks, benefits and alternatives for the proposed anesthesia with the patient or authorized representative who has indicated his/her understanding and acceptance.     Dental Advisory Given  Plan Discussed with: Anesthesiologist, CRNA and Surgeon  Anesthesia Plan Comments: (Patient consented for risks of anesthesia including but not limited to:  - adverse  reactions to medications - damage to teeth, lips or other oral mucosa - sore throat or hoarseness - Damage to heart, brain, lungs or loss of life  Patient voiced understanding.)        Anesthesia Quick Evaluation

## 2019-08-20 NOTE — Op Note (Signed)
08/20/2019  PATIENT:  Katrina Jimenez  39 y.o. female  PRE-OPERATIVE DIAGNOSIS:  menorrhagia  POST-OPERATIVE DIAGNOSIS:  Menorrhagia, pelvic congestion bilaterally  PROCEDURE:  Procedure(s): XI ROBOTIC ASSISTED TOTAL HYSTERECTOMY (N/A)  SURGEON:  Surgeon(s) and Role:    * Donnelle Rubey, Elenora Fender, MD - Primary    Christeen Douglas, MD - Assisting  ANESTHESIA: GET  EBL:  Total I/O In: 600 [I.V.:600] Out: 1050 [Urine:1000; Blood:50]  DRAINS: foley to gravity   SPECIMEN: Uterus, portion of left fallopian tube    DISPOSITION OF SPECIMEN:  To pathology  COUNTS: correct x2  COMPLICATIONS: none apparent  PATIENT DISPOSITION:  VS stable to PACU   FINDINGS:  Normal upper abdomen, uterus mobile, remnant of left fallopian tube, normal bilateral ovaries.  Vascular congestion in bilateral ovarian and uterine vessels.   Indication for surgery: Patient had presented with complaints of menorrhagia and dysmenorrhea.  Various treatment options were discussed and patient requested a hysterectomy to resolve her symptoms. Risks benefits and alternatives were reviewed and informed consent was obtained.   Procedure: The patient was brought to the OR and identified as ELSEY HOLTS.  She was given general anesthesia via endotracheal route.  Nasogastric tube was placed.  She was then positioned in the dorsal lithotomy position and prepped and draped in the usual sterile fashion.  A surgical time-out was called. A foley catheter was placed.  A speculum was placed in the vagina and the cervix was visualized, grasped with a single tooth tenaculum and the uterine sound was inserted and attached to the tenaculum to serve as a uterine manipulator.  After a change of gloves, the attention was turned to the abdomen.  A midline incision was made infraumbilically.  The subcutaneous tissues were dissected, the fascia was divided, the peritoneum entered, and a robotic trochar was inserted.  Opening pressure was .  Pneumoperitoneum was created to .  Two Robotic trochars were inserted atraumatically under visualization.  The patient was placed in steep trendelenburg, and the daVinci robot was docked and monopolar scissors and a bipolar grasper were employed.  A brief survey of the upper abdomen was performed.  The attention was turned to the pelvis.  The bilateral round ligaments were cauterized and transected. The bilateral utero-ovarian ligaments and vessels were cauterized and divided.  The anterior peritoneum was divided and a bladder flap was created.  The underlying peritoneum was divided and the bilateral uterine arteries were skeletonized, sealed, and divided.  The cervix was transected and the uterine sound and tenaculum removed.  The cerivcal canal was cauterized.   The cervical bed and surrounding pedicles were hemostatic.   The robot was undocked, and instruments removed.  The umbilical port was removed, and the skin and fascial incisions were expanded and the uterus was placed into an endocatch bag.  The bag was retracted through the port site and an Teacher, early years/pre inserted.  Using the ExCITE technique, the uterus was morcellated through this incision.  The bag was pulled through and the abdominal cavity was inspected and excess fluid was removed.   The fascia of the umbilical incision was closed with an 0-vicryl in a running stitch. The initial stitch was insufficiently closed, so it was removed and another stitch placed.  This was tested for integrity and intact.    The skin of all incision were closed with 4-0 monocryl and covered with surgical glue.    The vagina was inspected for hemostasis at the tenaculum sites on the cervix.  One  was briskly bleeding, and did not stop with silver nitrate.  A vertical mattress suture was placed and was then hemostatic.      The procedure was then deemed complete. The sponge, needle, and instrument counts were correct x2.  The patient tolerated reversal of  anesthesia, and was brought to the PACU in a stable condition.  I was present and performed this case in its entirety. Larey Days, MD Attending Obstetrician and Gynecologist [facility]

## 2019-08-20 NOTE — H&P (Signed)
Preoperative History and Physical  Katrina Jimenez is a 39 y.o. [id number] here for surgical management of menorrhagia and dysmenorrhea.   No significant preoperative concerns.  Proposed surgery: Robotic-assisted supracervical laparoscopic hysterectomy, bilateral salpingectomy.   Past Medical History:  Diagnosis Date  . Anemia   . Complication of anesthesia    difficult to wake up  . Medical history non-contributory    Past Surgical History:  Procedure Laterality Date  . CHOLECYSTECTOMY    . TUBAL LIGATION Bilateral 04/23/2017   Procedure: POST PARTUM TUBAL LIGATION;  Surgeon: Tedrick Port, Elenora Fender, MD;  Location: [facility] ORS;  Service: Gynecology;  Laterality: Bilateral;   OB History  Gravida Para Term Preterm AB Living  7 2 2   4 3   SAB TAB Ectopic Multiple Live Births    4   0 3    # Outcome Date GA Lbr Len/2nd Weight Sex Delivery Anes PTL Lv  7 Term 04/22/17 [redacted]w[redacted]d / 00:14 2960 g F Vag-Spont EPI  LIV     Birth Comments: no anomalies observed at delivery  6 TAB 06/22/16          5 TAB 02/21/15          4 Gravida 05/06/11    M Vag-Spont   LIV  3 TAB 09/20/04          2 Term 03/24/04    F Vag-Spont   LIV  1 TAB 11/20/00          Patient denies any other pertinent gynecologic issues.   No current facility-administered medications on file prior to encounter.   Current Outpatient Medications on File Prior to Encounter  Medication Sig Dispense Refill  . ibuprofen (ADVIL,MOTRIN) 600 MG tablet Take 1 tablet (600 mg total) by mouth every 6 (six) hours. 65 tablet 0   No Known Allergies  Social History:   reports that she has been smoking. She has been smoking about 0.25 packs per day. She has never used smokeless tobacco. She reports that she does not drink alcohol or use drugs.  Family History  Problem Relation Age of Onset  . Hypertension Mother   . Diabetes Mother   . Obesity Mother   . Diabetes Paternal Grandmother     Review of Systems: Noncontributory  PHYSICAL EXAM: Blood  pressure (!) 142/92, pulse 69, temperature 98 F (36.7 C), temperature source Tympanic, resp. rate 16, last menstrual period 07/23/2019, SpO2 100 %, unknown if currently breastfeeding. General appearance - alert, well appearing, and in no distress Chest - clear to auscultation, no wheezes, rales or rhonchi, symmetric air entry Heart - normal rate and regular rhythm Abdomen - soft, nontender, nondistended, no masses or organomegaly Pelvic - examination not indicated Extremities - peripheral pulses normal, no pedal edema, no clubbing or cyanosis  Labs: Results for orders placed or performed during the hospital encounter of 08/20/19 (from the past 336 hour(s))  ABO/Rh   Collection Time: 08/20/19  6:38 AM  Result Value Ref Range   ABO/RH(D) PENDING   Pregnancy, urine POC   Collection Time: 08/20/19  7:02 AM  Result Value Ref Range   Preg Test, Ur NEGATIVE NEGATIVE  Results for orders placed or performed during the hospital encounter of 08/15/19 (from the past 336 hour(s))  SARS CORONAVIRUS 2 (TAT 6-24 HRS) Nasopharyngeal Nasopharyngeal Swab   Collection Time: 08/15/19  8:26 AM   Specimen: Nasopharyngeal Swab  Result Value Ref Range   SARS Coronavirus 2 NEGATIVE NEGATIVE  Basic metabolic panel  Collection Time: 08/15/19  8:26 AM  Result Value Ref Range   Sodium 139 135 - 145 mmol/L   Potassium 3.9 3.5 - 5.1 mmol/L   Chloride 108 98 - 111 mmol/L   CO2 23 22 - 32 mmol/L   Glucose, Bld 104 (H) 70 - 99 mg/dL   BUN 16 6 - 20 mg/dL   Creatinine, Ser 1.11 (H) 0.44 - 1.00 mg/dL   Calcium 9.2 8.9 - 10.3 mg/dL   GFR calc non Af Amer >60 >60 mL/min   GFR calc Af Amer >60 >60 mL/min   Anion gap 8 5 - 15  CBC   Collection Time: 08/15/19  8:26 AM  Result Value Ref Range   WBC 11.1 (H) 4.0 - 10.5 K/uL   RBC 4.78 3.87 - 5.11 MIL/uL   Hemoglobin 12.8 12.0 - 15.0 g/dL   HCT 39.7 36.0 - 46.0 %   MCV 83.1 80.0 - 100.0 fL   MCH 26.8 26.0 - 34.0 pg   MCHC 32.2 30.0 - 36.0 g/dL   RDW 14.5 11.5  - 15.5 %   Platelets 273 150 - 400 K/uL   nRBC 0.0 0.0 - 0.2 %  Type and screen [place]   Collection Time: 08/15/19  8:26 AM  Result Value Ref Range   ABO/RH(D) O POS    Antibody Screen NEG    Sample Expiration 08/29/2019,2359    Extend sample reason      NO TRANSFUSIONS OR PREGNANCY IN THE PAST 3 MONTHS Performed at [facility], [address], [place], [place] [address]     Imaging Studies: No results found.  Assessment: Patient Active Problem List   Diagnosis Date Noted  . Supervision of high-risk pregnancy, third trimester 04/22/2017  . Advanced maternal age in multigravida, first trimester     Plan: Patient will undergo surgical management with Robotic-assisted supracervical laparoscopic hysterectomy, bilateral salpingectomy.   The risks of surgery were discussed in detail with the patient including but not limited to: bleeding which may require transfusion or reoperation; infection which may require antibiotics; injury to surrounding organs which may involve bowel, bladder, ureters ; need for additional procedures including laparoscopy or laparotomy; thromboembolic phenomenon, surgical site problems and other postoperative/anesthesia complications. Likelihood of success in alleviating the patient's condition was discussed. Routine postoperative instructions will be reviewed with the patient and her family in detail after surgery.  The patient concurred with the proposed plan, giving informed written consent for the surgery.  Patient has been NPO since last night she will remain NPO for procedure.  Anesthesia and OR aware.  Preoperative prophylactic antibiotics and SCDs ordered on call to the OR.  To OR when ready.  ----- Larey Days, MD, [place] Attending Obstetrician and Gynecologist [facility], Department of [facility]  08/20/2019 7:29 AM

## 2019-08-20 NOTE — OR Nursing (Signed)
Discussed rx for ibuprofen (not signed) with Dr. Elesa Massed via tele.   She advises both ibuprofen and oxycodone rx sent to patient's pharmacy.   Written rx for ibuprofen shredded in postop.

## 2019-08-21 LAB — SURGICAL PATHOLOGY

## 2019-08-21 NOTE — Anesthesia Postprocedure Evaluation (Signed)
Anesthesia Post Note  Patient: Katrina Jimenez  Procedure(s) Performed: XI ROBOTIC ASSISTED TOTAL HYSTERECTOMY (N/A )  Patient location during evaluation: PACU Anesthesia Type: General Level of consciousness: awake and alert and oriented Pain management: pain level controlled Vital Signs Assessment: post-procedure vital signs reviewed and stable Respiratory status: spontaneous breathing Cardiovascular status: blood pressure returned to baseline Anesthetic complications: no     Last Vitals:  Vitals:   08/20/19 1253 08/20/19 1350  BP: 138/88 132/89  Pulse: (!) 50 63  Resp: 16 16  Temp: 36.5 C   SpO2: 99% 100%    Last Pain:  Vitals:   08/21/19 0819  TempSrc:   PainSc: 9                  [name]

## 2020-12-20 NOTE — Progress Notes (Signed)
Sleep Medicine   Office Visit  Patient Name: Katrina Jimenez DOB: [birth year] MRN [id number]    Chief Complaint: evaluation possible OSA  Brief History:  Katrina Jimenez presents with a family history of severe obesity. This is sleeping in the prone position.   Sleep quality is very poor. This is noted all nights. The patient's children report  loud snoring every night. The patient relates the following symptoms: severe obesity, circadian disruption, poor sleep quality, tired, fatigue, daytime sleepiness. The patient goes to sleep at 1130 pm, falling asleep immediately in less than 5 minutes.  Patient said she wakes up at 4 am for bathroom, immediately goes back to sleep.  she reports that her sleep quality is getting worse with increasing moodiness, and irritability.   Sleep quality is just as poor when outside home environment.  Patient has noted no restlessness of her legs at night.  She said she has occasional morning headaches that usually resolve in 1 - 2 hours after drinking water and sometimes taking Tylenol.The patient  relates no unusual behavior during the night.  The patient denies a history of psychiatric problems. The Epworth Sleepiness Score is 6 out of 24 .  The patient relates  Cardiovascular risk factors include: none     ROS  General: (-) fever, (-) chills, (-) night sweat Nose and Sinuses: (-) nasal stuffiness or itchiness, (-) postnasal drip, (-) nosebleeds, (-) sinus trouble. Mouth and Throat: (-) sore throat, (-) hoarseness. Neck: (-) swollen glands, (-) enlarged thyroid, (-) neck pain. Respiratory: - cough, - shortness of breath, - wheezing. Neurologic: - numbness, - tingling. Psychiatric: - anxiety, - depression Sleep behavior: -sleep paralysis -hypnogogic hallucinations -dream enactment      -vivid dreams -cataplexy -night terrors -sleep walking   Current Medication: Outpatient Encounter Medications as of 12/22/2020  Medication Sig   ibuprofen (ADVIL) 600 MG tablet Take 1  tablet (600 mg total) by mouth every 6 (six) hours.   No facility-administered encounter medications on file as of 12/22/2020.    Surgical History: Past Surgical History:  Procedure Laterality Date   CHOLECYSTECTOMY     ROBOTIC ASSISTED TOTAL HYSTERECTOMY WITH BILATERAL SALPINGO OOPHERECTOMY N/A 08/20/2019   Procedure: XI ROBOTIC ASSISTED TOTAL HYSTERECTOMY;  Surgeon: Ward, Elenora Fender, MD;  Location: [facility] ORS;  Service: Gynecology;  Laterality: N/A;   TUBAL LIGATION Bilateral 04/23/2017   Procedure: POST PARTUM TUBAL LIGATION;  Surgeon: Ward, Elenora Fender, MD;  Location: [facility] OR[facility];  Service: Gynecology;  Laterality: Bilateral;    Medical History: Past Medical History:  Diagnosis Date   Anemia    Complication of anesthesia    difficult to wake up   Medical history non-contributory     Family History: Non contributory to the present illness  Social History: Social History   Socioeconomic History   Marital status: Single    Spouse name: Not on file   Number of children: Not on file   Years of education: Not on file   Highest education level: Not on file  Occupational History   Not on file  Tobacco Use   Smoking status: Some Days    Packs/day: 0.25    Pack years: 0.00    Types: Cigarettes   Smokeless tobacco: Never  Vaping Use   Vaping Use: Never used  Substance and Sexual Activity   Alcohol use: No   Drug use: No   Sexual activity: Yes    Birth control/protection: Surgical  Other Topics Concern   Not on file  Social  History Narrative   Not on file   Social Determinants of Health   Financial Resource Strain: Not on file  Food Insecurity: Not on file  Transportation Needs: Not on file  Physical Activity: Not on file  Stress: Not on file  Social Connections: Not on file  Intimate Partner Violence: Not on file    Vital Signs: Blood pressure 131/70, pulse (!) 55, resp. rate 16, height 5\' 8"  (1.727 m), weight 291 lb 4.8 oz (132.1 kg), SpO2 100 %, unknown if  currently breastfeeding.  Examination: General Appearance: The patient is well-developed, well-nourished, and in no distress. Neck Circumference: 41 Skin: Gross inspection of skin unremarkable. Head: normocephalic, no gross deformities. Eyes: no gross deformities noted. ENT: ears appear grossly normal mallampati IV Neurologic: Alert and oriented. No involuntary movements.    EPWORTH SLEEPINESS SCALE:  Scale:  (0)= no chance of dozing; (1)= slight chance of dozing; (2)= moderate chance of dozing; (3)= high chance of dozing  Chance  Situtation    Sitting and reading: 1    Watching TV: 1    Sitting Inactive in public: 0    As a passenger in car: 3      Lying down to rest: 1    Sitting and talking: 0    Sitting quielty after lunch: 0    In a car, stopped in traffic: 0   TOTAL SCORE:   6 out of 24    SLEEP STUDIES:  No studies on file   LABS: No results found for this or any previous visit (from the past 2160 hour(s)).  Radiology: No results found.  No results found.  No results found.    Assessment and Plan: Patient Active Problem List   Diagnosis Date Noted   Morbid obesity (HCC) 12/22/2020   OSA (obstructive sleep apnea) 12/22/2020   Supervision of high-risk pregnancy, third trimester 04/22/2017   Advanced maternal age in multigravida, first trimester    1. OSA (obstructive sleep apnea) PLAN OSA:   Patient evaluation suggests high risk of sleep disordered breathing due to small airway/Mallampati IV, daytime somnolence, morbid obesity, morning headaches.  Suggest: PSG  to treat the patient's sleep disordered breathing. The patient was also counselled on weight loss to optimize sleep health.   2. Morbid obesity (HCC) Obesity Counseling: Had a lengthy discussion regarding patients BMI and weight issues. Patient was instructed on portion control as well as increased activity. Also discussed caloric restrictions with trying to maintain intake less  than 2000 Kcal. Discussions were made in accordance with the 5As of weight management. Simple actions such as not eating late and if able to, taking a walk is suggested.    General Counseling: I have discussed the findings of the evaluation and examination with Katrina Jimenez.  I have also discussed any further diagnostic evaluation thatmay be needed or ordered today. Porsha verbalizes understanding of the findings of todays visit. We also reviewed her medications today and discussed drug interactions and side effects including but not limited excessive drowsiness and altered mental states. We also discussed that there is always a risk not just to her but also people around her. she has been encouraged to call the office with any questions or concerns that should arise related to todays visit.  No orders of the defined types were placed in this encounter.       I have personally obtained a history, evaluated the patient, evaluated pertinent data, formulated the assessment and plan and placed orders.  This patient was  seen today by Tressie Ellis, PA-C in collaboration with Dr. Devona Konig.   Allyne Gee, MD [facility] Diplomate ABMS Pulmonary and Critical Care Medicine Sleep medicine

## 2020-12-22 ENCOUNTER — Ambulatory Visit (INDEPENDENT_AMBULATORY_CARE_PROVIDER_SITE_OTHER): Payer: Commercial Managed Care - PPO | Admitting: Internal Medicine

## 2020-12-22 VITALS — BP 131/70 | HR 55 | Resp 16 | Ht 68.0 in | Wt 291.3 lb

## 2020-12-22 DIAGNOSIS — G4733 Obstructive sleep apnea (adult) (pediatric): Secondary | ICD-10-CM

## 2021-01-06 ENCOUNTER — Encounter: Payer: Self-pay | Admitting: Podiatry

## 2021-01-06 ENCOUNTER — Ambulatory Visit (INDEPENDENT_AMBULATORY_CARE_PROVIDER_SITE_OTHER): Payer: Commercial Managed Care - PPO | Admitting: Podiatry

## 2021-01-06 ENCOUNTER — Other Ambulatory Visit: Payer: Self-pay

## 2021-01-06 DIAGNOSIS — Q828 Other specified congenital malformations of skin: Secondary | ICD-10-CM

## 2021-01-06 NOTE — Progress Notes (Signed)
  Subjective:  Patient ID: Katrina Jimenez, female    DOB: 06-28-1981,  MRN: [id number]  Chief Complaint  Patient presents with   Callouses    Painful hard place on the Inside of left heel for about 3-4 months     40 y.o. female presents with the above complaint.  Patient presents with complaint of left medial side heel porokeratosis/benign skin lesion.  Patient states is painful to touch.  Patient states been over 3 to 4 months has progressed to gotten worse.  Patient would like to discuss treatment options she is tried some various over-the-counter medication none of which has helped.  She constantly walks on her foot.  She denies any other acute complaints.   Review of Systems: Negative except as noted in the HPI. Denies N/V/F/Ch.  Past Medical History:  Diagnosis Date   Anemia    Complication of anesthesia    difficult to wake up   Medical history non-contributory     Current Outpatient Medications:    ibuprofen (ADVIL) 600 MG tablet, Take 1 tablet (600 mg total) by mouth every 6 (six) hours., Disp: 65 tablet, Rfl: 1  Social History   Tobacco Use  Smoking Status Some Days   Packs/day: 0.25   Pack years: 0.00   Types: Cigarettes  Smokeless Tobacco Never    No Known Allergies Objective:  There were no vitals filed for this visit. There is no height or weight on file to calculate BMI. Constitutional Well developed. Well nourished.  Vascular Dorsalis pedis pulses palpable bilaterally. Posterior tibial pulses palpable bilaterally. Capillary refill normal to all digits.  No cyanosis or clubbing noted. Pedal hair growth normal.  Neurologic Normal speech. Oriented to person, place, and time. Epicritic sensation to light touch grossly present bilaterally.  Dermatologic Hyperkeratotic lesion with central nucleated core noted left medial heel.  Pain on palpation.  No pinpoint bleeding noted.  Orthopedic: Normal joint ROM without pain or crepitus bilaterally. No visible  deformities. No bony tenderness.   Radiographs: None Assessment:   1. Porokeratosis    Plan:  Patient was evaluated and treated and all questions answered.  Left medial heel porokeratosis -I explained to the patient the etiology of porokeratosis and various treatment options were discussed.  Given the amount of pain that she is having I believe patient will benefit from aggressive debridement of the lesion.  Patient agrees with the plan -Using chisel blade to handle the lesion was debrided down to healthy striated tissue no pinpoint bleeding noted no complication noted. -If there is no improvement we will discuss Cantharone therapy during next clinical visit.  No follow-ups on file.

## 2021-01-20 ENCOUNTER — Encounter (INDEPENDENT_AMBULATORY_CARE_PROVIDER_SITE_OTHER): Payer: Commercial Managed Care - PPO | Admitting: Internal Medicine

## 2021-01-20 DIAGNOSIS — G4719 Other hypersomnia: Secondary | ICD-10-CM | POA: Diagnosis not present

## 2021-01-28 DIAGNOSIS — G4719 Other hypersomnia: Secondary | ICD-10-CM | POA: Insufficient documentation

## 2021-01-28 NOTE — Procedures (Signed)
[facility]  Polysomnogram Report Part I                                                                 Phone: [phone] Fax: [phone]  Patient Name: Katrina Jimenez, Katrina Jimenez Acquisition Number: [id number]  Date of Birth: 05/27/1981 Acquisition Date: 01/20/2021  Referring Physician: Darolyn Rua, PA     History: The patient is a 40 year old female who was referred for evaluation of possible sleep apnea. Medical History: Anemia, Complication of anesthesia (difficult to wake up)  Medications: ibuprofen  Procedure: This routine overnight polysomnogram was performed on the Alice 4 or 5 using the standard diagnostic protocol. This included 6 channels of EEG, 2 channels of EOG, chin EMG, bilateral anterior tibialis EMG, nasal/oral thermister, PTAF (nasal pressure transducer), chest and abdominal wall movements, EKG, pulse oximetry and EtCO2 when appropriate.  Description: The total recording time was 455.4 minutes. The total sleep time was 389.5 minutes. There were a total of 56.5 minutes of wakefulness after sleep onset for a reducedsleep efficiency of 85.5%. The latency to sleep onset was short at 9.4 minutes. The R sleep onset latency was short at 48.0 minutes. Sleep parameters, as a percentage of the total sleep time, demonstrated 9.1% of sleep was in N1 sleep, 55.6% N2, 10.8% N3 and 24.5% R sleep. There were a total of 21 arousals for an arousal index of 3.2 arousals per hour of sleep that was normal.  Respiratory monitoring demonstrated rare mild to moderate degree of snoring on the side. There were 1 apneas and hypopneas for an Apnea Hypopnea Index of 0.2 apneas and hypopneas per hour of sleep. The REM related apnea hypopnea index was 0.0/hr of REM sleep compared to a NREM AHI of 0.2/hr.  The average duration of the respiratory events was 32.0 seconds with a maximum duration of 32.0 seconds. The respiratory events occurred in all positions. The respiratory events were associated with  peripheral oxygen desaturations on the average to 95%. The lowest oxygen desaturation associated with a respiratory event was 95%. Additionally, the baseline oxygen saturation during wakefulness was 99%, during NREM sleep averaged 99%, and during REM sleep averaged  99%. The total duration of oxygen < 90% was 0.0 minutes.  Cardiac monitoring- did not demonstrate transient cardiac decelerations associated with the apneas. There were no significant cardiac rhythm irregularities.   Periodic limb movement monitoring- did not demonstrate periodic limb movements.   Impression: This routine overnight polysomnogram did not demonstrate significant obstructive sleep apnea due to a low Apnea Hypopnea Index of 0.2 apneas and hypopneas per hour. . The respiratory events were associated with peripheral oxygen desaturations on the average to 95% with the lowest desaturation to 95%.       There was a reduced sleep efficiency increased awakeningsfailure to progress into the deeper stages of sleepThese findings would appear to be due to the increased upper airway resistance.  Recommendation:  CPAP titration study is not indicated in this case Nasal Decongestants and antihistamines may be of help for increased upper airway resistance. Clinical correlation is recommended. Please feel free to call the office for any further questions or assistance in the care of this patient.    [name], MD [facility] Diplomate ABMS Pulmonary Critical Care  Medicine Sleep Medicine Electronically reviewed and digitally signed      [facility] Polysomnogram Report Part II  Phone: [phone] Fax: [phone]  Patient last name Jimenez Neck Size 16.3 in. Acquisition [phone]  Patient first name Katrina Weight 291.0 lbs. Started 01/20/2021 at 9:18:07 PM  Birth date 1980-07-23 Height 68.0 in. Stopped 01/21/2021 at 5:01:25 AM  Age 37 BMI 44.2 lb/in2 Duration 455.4  Study Type Adult      Report generated by [employer] [place], RPSGT Sleep Data: Lights Out: 9:25:13 PM Sleep Onset: 9:34:37 PM  Lights On: 5:00:37 AM Sleep Efficiency: 85.5 %  Total Recording Time: 455.4 min Sleep Latency (from Lights Off) 9.4 min  Total Sleep Time (TST): 389.5 min R Latency (from Sleep Onset): 48.0 min  Sleep Period Time: 446.0 min Total number of awakenings: 24  Wake during sleep: 56.5 min Wake After Sleep Onset (WASO): 56.5 min   Sleep Data:         Arousal Summary: Stage  Latency from lights out (min) Latency from sleep onset (min) Duration (min) % Total Sleep Time  Normal values  N 1 9.4 0.0 35.5 9.1 (5%)  N 2 13.9 4.5 216.5 55.6 (50%)  N 3 94.4 85.0 42.0 10.8 (20%)  R 57.4 48.0 95.5 24.5 (25%)    Number Index  Spontaneous 44 6.8  Apneas & Hypopneas 1 0.2  RERAs 0 0.0       (Apneas & Hypopneas & RERAs)  (1) (0.2)  Limb Movement 0 0.0  Snore 0 0.0  TOTAL 45 6.9     Respiratory Data:  CA OA MA Apnea Hypopnea* A+ H RERA Total  Number 0 0 0 0 1 1 0 1  Mean Dur (sec) 0.0 0.0 0.0 0.0 32.0 32.0 0.0 32.0  Max Dur (sec) 0.0 0.0 0.0 0.0 32.0 32.0 0.0 32.0  Total Dur (min) 0.0 0.0 0.0 0.0 0.5 0.5 0.0 0.5  % of TST 0.0 0.0 0.0 0.0 0.1 0.1 0.0 0.1  Index (#/h TST) 0.0 0.0 0.0 0.0 0.2 0.2 0.0 0.2  *Hypopneas scored based on 4% or greater desaturation.  Sleep Stage:     Body Position Data:   REM NREM TST  AHI 0.0 0.2 0.2  RDI 0.0 0.2 0.2         Sleep (min) TST (%) REM (min) NREM (min) CA (#) OA (#) MA (#) HYP (#) AHI (#/h) RERA (#) RDI (#/h) Desat (#)  Supine 24.0 6.16 0.0 24.0 0 0 0 0 0.0 0 0.00 0  Non-Supine 365.50 93.84 95.50 270.00 0.00 0.00 0.00 1.00 0.16 0 0.16 3.00  Left: 344.5 88.45 95.5 249.0 0 0 0 1 0.2 0 0.2 2  Right: 21.0 5.39 0.0 21.0 0 0 0 0 0.0 0 0.00 1       Snoring: Total number of snoring episodes  0  Total time with snoring    min (   % of sleep)   Oximetry Distribution:             WK REM NREM TOTAL  Average (%)   99 99 99 99  < 90% 0.0 0.0 0.0 0.0  < 80% 0.0 0.0  0.0 0.0  < 70% 0.0 0.0 0.0 0.0  # of Desaturations* 2 0 1 3  Desat Index (#/hour) 2.0 0.0 0.2 0.5  Desat Max (%) 4 0 4 4  Desat Max Dur (sec) 30.0 0.0 10.0 30.0  Approx Min O2 during sleep 96  Approx min  O2 during a respiratory event 95  Was Oxygen added (Y/N) and final rate No:   0 LPM  *Desaturations based on 4% or greater drop from baseline.   Cheyne Stokes Breathing: None Present   Hypoventilation: None Present    Heart Rate Summary:  Average Heart Rate During Sleep 47.6 bpm      Highest Heart Rate During Sleep (95th %) 53.0 bpm      Highest Heart Rate During Sleep 99 bpm      Highest Heart Rate During Recording (TIB) 205 bpm (artifact)   Heart Rate Observations: Event Type # Events   Bradycardia 0 Lowest HR Scored: N/A  Sinus Tachycardia During Sleep 0 Highest HR Scored: N/A  Narrow Complex Tachycardia 0 Highest HR Scored: N/A  Wide Complex Tachycardia 0 Highest HR Scored: N/A  Asystole 0 Longest Pause: N/A  Atrial Fibrillation 0 Duration Longest Event: N/A  Other Arrythmias  No Type:    Periodic Limb Movement Data: (Primary legs unless otherwise noted) Total # Limb Movement 0 Limb Movement Index 0.0  Total # PLMS    PLMS Index     Total # PLMS Arousals    PLMS Arousal Index     Percentage Sleep Time with PLMS   min (   % sleep)  Mean Duration limb movements (secs)

## 2021-02-03 ENCOUNTER — Encounter: Payer: Self-pay | Admitting: Podiatry

## 2021-02-03 ENCOUNTER — Ambulatory Visit (INDEPENDENT_AMBULATORY_CARE_PROVIDER_SITE_OTHER): Payer: Commercial Managed Care - PPO | Admitting: Podiatry

## 2021-02-03 ENCOUNTER — Other Ambulatory Visit: Payer: Self-pay

## 2021-02-03 DIAGNOSIS — L989 Disorder of the skin and subcutaneous tissue, unspecified: Secondary | ICD-10-CM

## 2021-02-03 DIAGNOSIS — Z01818 Encounter for other preprocedural examination: Secondary | ICD-10-CM

## 2021-02-03 DIAGNOSIS — L0889 Other specified local infections of the skin and subcutaneous tissue: Secondary | ICD-10-CM

## 2021-02-03 NOTE — Progress Notes (Signed)
  Subjective:  Patient ID: Katrina Jimenez, female    DOB: Aug 29, 1980,  MRN: [id number]  Chief Complaint  Patient presents with   Callouses    "It hasn't been bothering me but I can still the core in it."    40 y.o. female presents with the above complaint.  Patient presents with continued follow-up from left medial side heel porokeratosis/benign skin lesion.  Patient states still painful to touch.  She states down there but better since debridement.  She would like to discuss treatment options including surgical if needed.  She denies any other acute complaints.  She would like to have it removed.   Review of Systems: Negative except as noted in the HPI. Denies N/V/F/Ch.  Past Medical History:  Diagnosis Date   Anemia    Complication of anesthesia    difficult to wake up   Medical history non-contributory     Current Outpatient Medications:    ibuprofen (ADVIL) 600 MG tablet, Take 1 tablet (600 mg total) by mouth every 6 (six) hours., Disp: 65 tablet, Rfl: 1  Social History   Tobacco Use  Smoking Status Some Days   Packs/day: 0.25   Types: Cigarettes  Smokeless Tobacco Never    No Known Allergies Objective:  There were no vitals filed for this visit. There is no height or weight on file to calculate BMI. Constitutional Well developed. Well nourished.  Vascular Dorsalis pedis pulses palpable bilaterally. Posterior tibial pulses palpable bilaterally. Capillary refill normal to all digits.  No cyanosis or clubbing noted. Pedal hair growth normal.  Neurologic Normal speech. Oriented to person, place, and time. Epicritic sensation to light touch grossly present bilaterally.  Dermatologic Hyperkeratotic lesion with central nucleated core noted left medial heel.  Pain on palpation.  No pinpoint bleeding noted.  Orthopedic: Normal joint ROM without pain or crepitus bilaterally. No visible deformities. No bony tenderness.   Radiographs: None Assessment:   1. Benign skin  lesion   2. Preoperative examination     Plan:  Patient was evaluated and treated and all questions answered.  Left medial heel porokeratosis -I explained to the patient the etiology of porokeratosis and various treatment options were discussed.  Given the amount of pain that she is having I believe patient will benefit from aggressive debridement of the lesion.  -I debrided the lesion down again to healthy striated tissue.  At this time given that she has failed all conservative treatment options I discussed surgical options of excising the benign skin lesion.  Patient states understand would like to proceed with surgical excision.  I discussed my preoperative intraoperative and postoperative plan in extensive detail.  She states understanding would like to proceed with surgery.  She will be weightbearing as tolerated in surgical shoe -Informed surgical risk consent was reviewed and read aloud to the patient.  I reviewed the films.  I have discussed my findings with the patient in great detail.  I have discussed all risks including but not limited to infection, stiffness, scarring, limp, disability, deformity, damage to blood vessels and nerves, numbness, poor healing, need for braces, arthritis, chronic pain, amputation, death.  All benefits and realistic expectations discussed in great detail.  I have made no promises as to the outcome.  I have provided realistic expectations.  I have offered the patient a 2nd opinion, which they have declined and assured me they preferred to proceed despite the risks   No follow-ups on file.

## 2021-02-13 ENCOUNTER — Telehealth: Payer: Self-pay

## 2021-02-13 NOTE — Telephone Encounter (Signed)
DOS 03/09/2021  EXC BENIGN LESION LT - 11426  [employer] MEDICAID EFFECTIVE DATE - 12/26/2020  PLAN DEDUCTIBLE - $0.00 OUT OF POCKET - $0.00 COPAY $0.00 COINSURANCE - $0.00   NOTIFICATION/PRIOR AUTHORIZATION NUMBER CASE STATUS CASE STATUS REASON PRIMARY CARE PHYSICIAN [id number] Closed Case Was Managed And Is Now Complete [name] ADVANCE NOTIFY DATE/TIME ADMISSION NOTIFY DATE/TIME 02/11/2021 07:56 AM CDT - COVERAGE STATUS OVERALL COVERAGE STATUS Covered/Approved 1-1 CODE DESCRIPTION COVERAGE STATUS DECISION DATE [facility] Coverage determination is reflected for the facility admission and is not a guarantee of payment for ongoing services. Covered/Approved 02/12/2021 1 11426 Excision, benign lesion including margin more Covered/Approved 02/12/2021

## 2021-03-09 ENCOUNTER — Other Ambulatory Visit: Payer: Self-pay | Admitting: Podiatry

## 2021-03-09 DIAGNOSIS — L989 Disorder of the skin and subcutaneous tissue, unspecified: Secondary | ICD-10-CM | POA: Diagnosis not present

## 2021-03-09 MED ORDER — OXYCODONE-ACETAMINOPHEN 5-325 MG PO TABS: 1.0000 | ORAL_TABLET | ORAL | 0 refills | Status: AC | PRN

## 2021-03-09 MED ORDER — IBUPROFEN 800 MG PO TABS: 800.0000 mg | ORAL_TABLET | Freq: Four times a day (QID) | ORAL | 1 refills | Status: AC | PRN

## 2021-03-11 DIAGNOSIS — M79676 Pain in unspecified toe(s): Secondary | ICD-10-CM

## 2021-03-16 ENCOUNTER — Encounter: Payer: Self-pay | Admitting: Podiatry

## 2021-03-17 ENCOUNTER — Ambulatory Visit (INDEPENDENT_AMBULATORY_CARE_PROVIDER_SITE_OTHER): Payer: Commercial Managed Care - PPO | Admitting: Podiatry

## 2021-03-17 ENCOUNTER — Other Ambulatory Visit: Payer: Self-pay

## 2021-03-17 ENCOUNTER — Encounter (INDEPENDENT_AMBULATORY_CARE_PROVIDER_SITE_OTHER): Payer: Self-pay

## 2021-03-17 DIAGNOSIS — Z9889 Other specified postprocedural states: Secondary | ICD-10-CM

## 2021-03-17 DIAGNOSIS — L989 Disorder of the skin and subcutaneous tissue, unspecified: Secondary | ICD-10-CM

## 2021-03-17 NOTE — Progress Notes (Signed)
  Subjective:  Patient ID: Katrina Jimenez, female    DOB: September 23, 1980,  MRN: [id number]  Chief Complaint  Patient presents with   Routine Post Olena Mater 9.12.22    DOS: 03/09/2021 Procedure: Left heel excision of benign skin lesion  40 y.o. female returns for post-op check.  Patient states she is doing well.  She denies any other acute complaints.  She has been ambulating more to the forefoot with a surgical shoe.  Her pain is being controlled with pain medication.  Bandages clean dry and intact  Review of Systems: Negative except as noted in the HPI. Denies N/V/F/Ch.  Past Medical History:  Diagnosis Date   Anemia    Complication of anesthesia    difficult to wake up   Medical history non-contributory     Current Outpatient Medications:    ibuprofen (ADVIL) 600 MG tablet, Take 1 tablet (600 mg total) by mouth every 6 (six) hours., Disp: 65 tablet, Rfl: 1   ibuprofen (ADVIL) 800 MG tablet, Take 1 tablet (800 mg total) by mouth every 6 (six) hours as needed., Disp: 60 tablet, Rfl: 1   oxyCODONE-acetaminophen (PERCOCET) 5-325 MG tablet, Take 1 tablet by mouth every 4 (four) hours as needed for severe pain., Disp: 30 tablet, Rfl: 0  Social History   Tobacco Use  Smoking Status Some Days   Packs/day: 0.25   Types: Cigarettes  Smokeless Tobacco Never    No Known Allergies Objective:  There were no vitals filed for this visit. There is no height or weight on file to calculate BMI. Constitutional Well developed. Well nourished.  Vascular Foot warm and well perfused. Capillary refill normal to all digits.   Neurologic Normal speech. Oriented to person, place, and time. Epicritic sensation to light touch grossly present bilaterally.  Dermatologic Skin healing well without signs of infection. Skin edges well coapted without signs of infection.  Orthopedic: Tenderness to palpation noted about the surgical site.   Radiographs: None Assessment:  No diagnosis found. Plan:   Patient was evaluated and treated and all questions answered.  S/p foot surgery left -Progressing as expected post-operatively. -XR: See above -WB Status: Forefoot weightbearing as tolerated in surgical shoe -Sutures: Intact.  No clinical signs of dehiscence noted no complication noted. -Medications: None -Foot redressed.  No follow-ups on file.

## 2021-03-20 DIAGNOSIS — M79676 Pain in unspecified toe(s): Secondary | ICD-10-CM

## 2021-03-31 ENCOUNTER — Ambulatory Visit (INDEPENDENT_AMBULATORY_CARE_PROVIDER_SITE_OTHER): Payer: Commercial Managed Care - PPO | Admitting: Podiatry

## 2021-03-31 ENCOUNTER — Encounter: Payer: Self-pay | Admitting: Podiatry

## 2021-03-31 ENCOUNTER — Other Ambulatory Visit: Payer: Self-pay

## 2021-03-31 DIAGNOSIS — L989 Disorder of the skin and subcutaneous tissue, unspecified: Secondary | ICD-10-CM

## 2021-03-31 NOTE — Progress Notes (Signed)
  Subjective:  Patient ID: Katrina Jimenez, female    DOB: 1980-11-26,  MRN: [id number]  Chief Complaint  Patient presents with   Routine Post Op    POV #2 DOS 03/09/2021 LT FOOT EXCISION OF BENIGN SKIN LESION OF HEEL    DOS: 03/09/2021 Procedure: Left heel excision of benign skin lesion  40 y.o. female returns for post-op check.  Patient states she is doing well.  She denies any other acute complaints.  She has been ambulating more to the forefoot with a surgical shoe.  Her pain is being controlled with pain medication.  Bandages clean dry and intact  Review of Systems: Negative except as noted in the HPI. Denies N/V/F/Ch.  Past Medical History:  Diagnosis Date   Anemia    Complication of anesthesia    difficult to wake up   Medical history non-contributory     Current Outpatient Medications:    ibuprofen (ADVIL) 600 MG tablet, Take 1 tablet (600 mg total) by mouth every 6 (six) hours., Disp: 65 tablet, Rfl: 1   ibuprofen (ADVIL) 800 MG tablet, Take 1 tablet (800 mg total) by mouth every 6 (six) hours as needed., Disp: 60 tablet, Rfl: 1   oxyCODONE-acetaminophen (PERCOCET) 5-325 MG tablet, Take 1 tablet by mouth every 4 (four) hours as needed for severe pain., Disp: 30 tablet, Rfl: 0  Social History   Tobacco Use  Smoking Status Some Days   Packs/day: 0.25   Types: Cigarettes  Smokeless Tobacco Never    No Known Allergies Objective:  There were no vitals filed for this visit. There is no height or weight on file to calculate BMI. Constitutional Well developed. Well nourished.  Vascular Foot warm and well perfused. Capillary refill normal to all digits.   Neurologic Normal speech. Oriented to person, place, and time. Epicritic sensation to light touch grossly present bilaterally.  Dermatologic Skin completely epithelialized.  No clinical signs of infection noted.  No recurrence noted.  Orthopedic: No further tenderness to palpation noted about the surgical site.    Radiographs: None Assessment:   1. Benign skin lesion   2. Status post foot surgery    Plan:  Patient was evaluated and treated and all questions answered.  S/p foot surgery left -Progressing as expected post-operatively. -XR: See above -WB Status: Regular shoes -Sutures: Removed no clinical signs of dehiscence noted no complication noted. -Medications: None -Patient is officially discharged from my care today.  At this time no recurrence noted.  I discussed with her if any foot and ankle issues arise in future I will asked her to come see me.  She states understanding.  No follow-ups on file.
